# Patient Record
Sex: Female | Born: 1965
Health system: Southern US, Community
[De-identification: ages and names within clinical notes are randomized; demographics above are authoritative.]

## PROBLEM LIST (undated history)

## (undated) DIAGNOSIS — I10 Essential (primary) hypertension: Secondary | ICD-10-CM

## (undated) DIAGNOSIS — I499 Cardiac arrhythmia, unspecified: Secondary | ICD-10-CM

## (undated) DIAGNOSIS — R0600 Dyspnea, unspecified: Secondary | ICD-10-CM

## (undated) DIAGNOSIS — M1990 Unspecified osteoarthritis, unspecified site: Secondary | ICD-10-CM

## (undated) DIAGNOSIS — I209 Angina pectoris, unspecified: Secondary | ICD-10-CM

## (undated) HISTORY — PX: VARICOSE VEIN SURGERY: SHX832

---

## 2015-12-24 DIAGNOSIS — H40013 Open angle with borderline findings, low risk, bilateral: Secondary | ICD-10-CM | POA: Diagnosis not present

## 2015-12-24 DIAGNOSIS — H2513 Age-related nuclear cataract, bilateral: Secondary | ICD-10-CM | POA: Diagnosis not present

## 2015-12-24 DIAGNOSIS — H35342 Macular cyst, hole, or pseudohole, left eye: Secondary | ICD-10-CM | POA: Diagnosis not present

## 2015-12-24 DIAGNOSIS — Q120 Congenital cataract: Secondary | ICD-10-CM | POA: Diagnosis not present

## 2015-12-24 DIAGNOSIS — H1851 Endothelial corneal dystrophy: Secondary | ICD-10-CM | POA: Diagnosis not present

## 2015-12-27 DIAGNOSIS — G43909 Migraine, unspecified, not intractable, without status migrainosus: Secondary | ICD-10-CM | POA: Insufficient documentation

## 2015-12-27 DIAGNOSIS — M79606 Pain in leg, unspecified: Secondary | ICD-10-CM | POA: Insufficient documentation

## 2015-12-27 DIAGNOSIS — H2513 Age-related nuclear cataract, bilateral: Secondary | ICD-10-CM | POA: Insufficient documentation

## 2015-12-27 DIAGNOSIS — H1851 Endothelial corneal dystrophy: Secondary | ICD-10-CM

## 2015-12-27 DIAGNOSIS — Q120 Congenital cataract: Secondary | ICD-10-CM | POA: Insufficient documentation

## 2015-12-27 DIAGNOSIS — H35342 Macular cyst, hole, or pseudohole, left eye: Secondary | ICD-10-CM | POA: Insufficient documentation

## 2015-12-27 DIAGNOSIS — H18519 Endothelial corneal dystrophy, unspecified eye: Secondary | ICD-10-CM | POA: Insufficient documentation

## 2015-12-30 DIAGNOSIS — Z1231 Encounter for screening mammogram for malignant neoplasm of breast: Secondary | ICD-10-CM | POA: Diagnosis not present

## 2015-12-30 DIAGNOSIS — Z6833 Body mass index (BMI) 33.0-33.9, adult: Secondary | ICD-10-CM | POA: Diagnosis not present

## 2015-12-30 DIAGNOSIS — Z01419 Encounter for gynecological examination (general) (routine) without abnormal findings: Secondary | ICD-10-CM | POA: Diagnosis not present

## 2016-01-31 DIAGNOSIS — Z1231 Encounter for screening mammogram for malignant neoplasm of breast: Secondary | ICD-10-CM | POA: Diagnosis not present

## 2016-05-19 ENCOUNTER — Emergency Department (HOSPITAL_BASED_OUTPATIENT_CLINIC_OR_DEPARTMENT_OTHER)
Admission: EM | Admit: 2016-05-19 | Discharge: 2016-05-19 | Disposition: A | Discharge status: Home / Self Care | Payer: Self-pay | Attending: Emergency Medicine | Admitting: Emergency Medicine

## 2016-05-19 ENCOUNTER — Emergency Department (HOSPITAL_BASED_OUTPATIENT_CLINIC_OR_DEPARTMENT_OTHER): Payer: Self-pay

## 2016-05-19 ENCOUNTER — Encounter (HOSPITAL_BASED_OUTPATIENT_CLINIC_OR_DEPARTMENT_OTHER): Payer: Self-pay | Admitting: *Deleted

## 2016-05-19 DIAGNOSIS — X503XXA Overexertion from repetitive movements, initial encounter: Secondary | ICD-10-CM | POA: Insufficient documentation

## 2016-05-19 DIAGNOSIS — Y999 Unspecified external cause status: Secondary | ICD-10-CM | POA: Insufficient documentation

## 2016-05-19 DIAGNOSIS — R0789 Other chest pain: Secondary | ICD-10-CM | POA: Insufficient documentation

## 2016-05-19 DIAGNOSIS — M546 Pain in thoracic spine: Secondary | ICD-10-CM | POA: Insufficient documentation

## 2016-05-19 DIAGNOSIS — Y9389 Activity, other specified: Secondary | ICD-10-CM | POA: Insufficient documentation

## 2016-05-19 DIAGNOSIS — M62830 Muscle spasm of back: Secondary | ICD-10-CM | POA: Insufficient documentation

## 2016-05-19 DIAGNOSIS — Y929 Unspecified place or not applicable: Secondary | ICD-10-CM | POA: Insufficient documentation

## 2016-05-19 LAB — URINALYSIS, ROUTINE W REFLEX MICROSCOPIC
Bilirubin Urine: NEGATIVE
Glucose, UA: NEGATIVE mg/dL
HGB URINE DIPSTICK: NEGATIVE
Ketones, ur: NEGATIVE mg/dL
LEUKOCYTES UA: NEGATIVE
Nitrite: NEGATIVE
PROTEIN: NEGATIVE mg/dL
Specific Gravity, Urine: 1.024 (ref 1.005–1.030)
pH: 6.5 (ref 5.0–8.0)

## 2016-05-19 MED ORDER — NAPROXEN 250 MG PO TABS
500.0000 mg | ORAL_TABLET | Freq: Once | ORAL | Status: AC
Start: 2016-05-19 — End: 2016-05-19
  Administered 2016-05-19: 500 mg via ORAL
  Filled 2016-05-19: qty 2

## 2016-05-19 MED ORDER — CYCLOBENZAPRINE HCL 10 MG PO TABS: 10.0000 mg | ORAL_TABLET | Freq: Three times a day (TID) | ORAL | 0 refills | Status: DC | PRN

## 2016-05-19 MED ORDER — NAPROXEN 500 MG PO TABS: 500.0000 mg | ORAL_TABLET | Freq: Two times a day (BID) | ORAL | 0 refills | Status: DC

## 2016-05-19 MED FILL — NAPROXEN 500 MG TABLET: 500 | 10 days supply | Qty: 20 | Fill #0

## 2016-05-19 MED FILL — CYCLOBENZAPRINE 10 MG TAB: 10 | 5 days supply | Qty: 15 | Fill #0

## 2016-05-19 NOTE — ED Provider Notes (Signed)
[facility] Provider Note   CSN: [id number] Arrival date & time: 05/19/16  1206     History   Chief Complaint Chief Complaint  Patient presents with  . Back Injury    HPI Tanya Kelly is a 51 y.o. female with a PMHx of chronic headaches, who presents to the ED with complaints of right mid thoracic back pain that began 2 days ago. 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 She describes the pain as 8/10 constant pulling/pinching pain in the right mid back radiating up to her neck, worse with sitting and movement especially bending and twisting, and mildly improved with laying down, ice, and ibuprofen use. She states that on date of onset, she was sent home early from work; she went back to work today and the pain worsened so she came here for evaluation.  She denies fevers, chills, CP, SOB, abd pain, N/V/D/C, hematuria, dysuria, myalgias, arthralgias, numbness, tingling, weakness, incontinence of urine/stool, saddle anesthesia/cauda equina symptoms, hx of IVDU/CA/kidney stones, or any other complaints at this time.     The history is provided by the patient and medical records. No language interpreter was used.  Back Pain   This is a new problem. The current episode started 2 days ago. The problem occurs constantly. The problem has not changed since onset.The pain is associated with twisting. The pain is present in the thoracic spine. Quality: pulling/pinching. Radiates to: neck. The pain is at a severity of 8/10. The pain is moderate. The symptoms are aggravated by bending and twisting. The pain is the same all the time. Pertinent negatives include no chest pain, no fever, no numbness, no abdominal pain, no bowel incontinence, no perianal numbness, no bladder incontinence, no dysuria, no paresthesias, no paresis, no tingling and no weakness. She has  tried NSAIDs and ice for the symptoms. The treatment provided mild relief.    History reviewed. No pertinent past medical history.  There are no active problems to display for this patient.   Past Surgical History:  Procedure Laterality Date  . CESAREAN SECTION    . VARICOSE VEIN SURGERY      OB History    No data available       Home Medications    Prior to Admission medications   Not on File    Family History No family history on file.  Social History Social History  Substance Use Topics  . Smoking status: Never Smoker  . Smokeless tobacco: Never Used  . Alcohol use Yes     Allergies   Codeine   Review of Systems Review of Systems  Constitutional: Negative for chills and fever.  Respiratory: Negative for shortness of breath.   Cardiovascular: Negative for chest pain.  Gastrointestinal: Negative for abdominal pain, bowel incontinence, constipation, diarrhea, nausea and vomiting.  Genitourinary: Positive for flank pain. Negative for bladder incontinence, dysuria and hematuria.  Musculoskeletal: Positive for back pain. Negative for arthralgias and myalgias.  Skin: Negative for color change.  Allergic/Immunologic: Negative for immunocompromised state.  Neurological: Negative for tingling, weakness, numbness and paresthesias.  Psychiatric/Behavioral: Negative for confusion.   10 Systems reviewed and are negative for acute change except as noted in the HPI.   Physical Exam Updated Vital Signs BP 141/92 (BP Location: Right Arm)   Pulse 66   Temp 98.5 F (36.9 C) (Oral)  Resp 18   Ht 5\' 1"  (1.549 m)   Wt 83.9 kg   SpO2 100%   BMI 34.96 kg/m   Physical Exam  Constitutional: She is oriented to person, place, and time. Vital signs are normal. She appears well-developed and well-nourished.  Non-toxic appearance. No distress.  Afebrile, nontoxic, NAD  HENT:  Head: Normocephalic and atraumatic.  Mouth/Throat: Mucous membranes are normal.  Eyes:  Conjunctivae and EOM are normal. Right eye exhibits no discharge. Left eye exhibits no discharge.  Neck: Normal range of motion. Neck supple.  Cardiovascular: Normal rate and intact distal pulses.   Pulmonary/Chest: Effort normal. No respiratory distress.     She exhibits tenderness. She exhibits no crepitus, no deformity and no retraction.  Chest wall with mild TTP to the R CVA/thoracic back area extending toward R lateral ribs, without crepitus, deformities, or retractions   Abdominal: Normal appearance and bowel sounds are normal. She exhibits no distension. There is no tenderness. There is CVA tenderness. There is no rigidity, no rebound, no guarding, no tenderness at McBurney's point and negative Murphy's sign.  Soft, NTND, +BS throughout, no r/g/r, neg murphy's, neg mcburney's, with mild R sided CVA TTP somewhat in the R thoracic paraspinous muscles but near the CVA region and toward the R lateral rib cage area  Musculoskeletal: Normal range of motion.       Thoracic back: She exhibits tenderness and spasm. She exhibits normal range of motion and no deformity.       Back:  Thoracic spine with FROM intact without spinous process TTP, no bony stepoffs or deformities, with mild R sided paraspinous muscle TTP and slight muscle spasm felt, around the CVA region. Strength and sensation grossly intact in all extremities, negative SLR bilaterally, gait steady and nonantalgic. No overlying skin changes. Distal pulses intact.   Neurological: She is alert and oriented to person, place, and time. She has normal strength. No sensory deficit.  Skin: Skin is warm, dry and intact. No rash noted.  Psychiatric: She has a normal mood and affect. Her behavior is normal.  Nursing note and vitals reviewed.    ED Treatments / Results  Labs (all labs ordered are listed, but only abnormal results are displayed) Labs Reviewed  URINALYSIS, ROUTINE W REFLEX MICROSCOPIC    EKG  EKG Interpretation None         Radiology Dg Abd Acute W/chest  Result Date: 05/19/2016 CLINICAL DATA:  Right flank pain since Wednesday EXAM: DG ABDOMEN ACUTE W/ 1V CHEST COMPARISON:  None. FINDINGS: Single-view chest demonstrates no acute infiltrate or effusion. Borderline to mild cardiomegaly without edema. No pneumothorax. Supine and upright views of the abdomen demonstrate no free air beneath the diaphragm. There is a nonobstructed bowel-gas pattern with moderate stool in the colon. 5 mm calcification in the right hemipelvis. Mild fecal impaction in the rectum. IMPRESSION: 1. Mild cardiomegaly.  No infiltrate or edema 2. Nonobstructed gas pattern 3. 5 mm calcification in the right hemipelvis, distal ureteral stone cannot be excluded. CT KUB could be obtained for further evaluation. Electronically Signed   By: Jasmine Pang M.D.   On: 05/19/2016 13:58    Procedures Procedures (including critical care time)  Medications Ordered in ED Medications  naproxen (NAPROSYN) tablet 500 mg (500 mg Oral Given 05/19/16 1354)     Initial Impression / Assessment and Plan / ED Course  I have reviewed the triage vital signs and the nursing notes.  Pertinent labs & imaging results that were available during  my care of the patient were reviewed by me and considered in my medical decision making (see chart for details).  Clinical Course     51 y.o. female here with R flank/thoracic back pain that began after she pulled herself up off the roller she was on while working underneath a bus; No red flag s/s of low back pain. No s/s of central cord compression or cauda equina. Lower extremities are neurovascularly intact and patient is ambulating without difficulty. Mild tenderness to R lateral rib cage area, and R flank area; no urinary complaints, but wonder if this is musculoskeletal vs ?kidney stone vs urinary infection; no abdominal tenderness. Will obtain U/A and Acute abd series to eval for stone/etc (acute abd series will give more  info than KUB, so will opt for this instead). Will give naprosyn and reassess shortly  2:37 PM U/A completely unremarkable. Acute abd series showing 5mm calcification in R hemipelvis, unable to r/o ureteral stone; given lack of abd pain that would be expected with a stone this far down, and lack of hematuria or any findings on U/A, doubt this truly represents a kidney stone; however, instructed pt to stay hydrated in order to help if this is a stone, then it'll help it pass. Otherwise, Pain likely from muscle strain. Patient was counseled on back pain precautions and told to do activity as tolerated but do not lift, push, or pull heavy objects more than 10 pounds for the next week. Patient counseled to use ice or heat on back for no longer than 15 minutes every hour.   Rx given for muscle relaxer and counseled on proper use of muscle relaxant medication. Urged patient not to drink alcohol, drive, or perform any other activities that requires focus while taking these medications. Rx given for naprosyn. Discussed additional tylenol use.   Patient urged to follow-up with PCP if pain does not improve with treatment and rest or if pain becomes recurrent. Urged to return with worsening severe pain, loss of bowel or bladder control, trouble walking. The patient verbalizes understanding and agrees with the plan.   Final Clinical Impressions(s) / ED Diagnoses   Final diagnoses:  Acute right-sided thoracic back pain  Muscle spasm of back    New Prescriptions New Prescriptions   CYCLOBENZAPRINE (FLEXERIL) 10 MG TABLET    Take 1 tablet (10 mg total) by mouth 3 (three) times daily as needed for muscle spasms.   NAPROXEN (NAPROSYN) 500 MG TABLET    Take 1 tablet (500 mg total) by mouth 2 (two) times daily with a meal.     [address]Deatrice Spanbauer Strupp Chasitty Hehl, PA-C 05/19/16 1438    Laurence Spatesachel Morgan Little, MD 05/23/16 [phone]

## 2016-05-19 NOTE — Discharge Instructions (Signed)
Your urine specimen looked fine today, and your xray shows a small calcification that COULD be a kidney stone but isn't definitely one, and since your urine sample was fine, it's doubtful that this is truly a kidney stone. Your back pain is likely from a pulled muscle or other musculoskeletal source. Follow the instructions below regarding back pain; however, make sure you stay very well hydrated in the event this is a kidney stone, it will help flush it out. Follow up with your regular doctor in 1 week for recheck of symptoms.  Back Pain: Your back pain should be treated with medicines such as ibuprofen or aleve and this back pain should get better over the next 2 weeks.  However if you develop severe or worsening pain, low back pain with fever, numbness, weakness or inability to walk or urinate, you should return to the ER immediately.  Please follow up with your doctor this week for a recheck if still having symptoms.  Avoid heavy lifting over 10 pounds over the next two weeks.  Back pain is discomfort in the back that may be due to injuries to muscles and ligaments around the spine.  Occasionally, it may be caused by a a problem to a part of the spine called a disc.  The pain may last several days or a week;  However, most patients get completely well in 4 weeks.  Self - care:  The application of heat can help soothe the pain.  Maintaining your daily activities, including walking, is encourged, as it will help you get better faster than just staying in bed. Perform gentle stretching as discussed. Drink plenty of fluids.  Medications are also useful to help with pain control.  A commonly prescribed medication includes tylenol. Use over the counter tylenol as directed by the bottle.  Non steroidal anti inflammatory medications including Ibuprofen and naproxen;  These medications help both pain and swelling and are very useful in treating back pain.  They should be taken with food, as they can cause  stomach upset, and more seriously, stomach bleeding.    Muscle relaxants:  These medications can help with muscle tightness that is a cause of lower back pain.  Most of these medications can cause drowsiness, and it is not safe to drive or use dangerous machinery while taking them.  SEEK IMMEDIATE MEDICAL ATTENTION IF: New numbness, tingling, weakness, or problem with the use of your arms or legs.  Severe back pain not relieved with medications.  Difficulty with or loss of control of your bowel or bladder control.  Increasing pain in any areas of the body (such as chest or abdominal pain).  Shortness of breath, dizziness or fainting.  Nausea (feeling sick to your stomach), vomiting, fever, or sweats.  You will need to follow up with  Your primary healthcare provider in 1-2 weeks for reassessment.

## 2016-05-19 NOTE — ED Triage Notes (Signed)
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

## 2016-10-14 DIAGNOSIS — M79661 Pain in right lower leg: Secondary | ICD-10-CM | POA: Diagnosis not present

## 2016-10-14 DIAGNOSIS — R200 Anesthesia of skin: Secondary | ICD-10-CM | POA: Diagnosis not present

## 2016-10-14 DIAGNOSIS — E876 Hypokalemia: Secondary | ICD-10-CM | POA: Diagnosis not present

## 2016-10-14 DIAGNOSIS — Z7982 Long term (current) use of aspirin: Secondary | ICD-10-CM | POA: Diagnosis not present

## 2016-10-14 DIAGNOSIS — Z885 Allergy status to narcotic agent status: Secondary | ICD-10-CM | POA: Diagnosis not present

## 2016-10-14 DIAGNOSIS — M79604 Pain in right leg: Secondary | ICD-10-CM | POA: Diagnosis not present

## 2017-04-08 ENCOUNTER — Encounter (HOSPITAL_BASED_OUTPATIENT_CLINIC_OR_DEPARTMENT_OTHER): Payer: Self-pay | Admitting: *Deleted

## 2017-04-08 ENCOUNTER — Other Ambulatory Visit: Payer: Self-pay

## 2017-04-08 ENCOUNTER — Emergency Department (HOSPITAL_BASED_OUTPATIENT_CLINIC_OR_DEPARTMENT_OTHER): Payer: BLUE CROSS/BLUE SHIELD

## 2017-04-08 ENCOUNTER — Emergency Department (HOSPITAL_BASED_OUTPATIENT_CLINIC_OR_DEPARTMENT_OTHER)
Admission: EM | Admit: 2017-04-08 | Discharge: 2017-04-08 | Disposition: A | Discharge status: Home / Self Care | Payer: BLUE CROSS/BLUE SHIELD | Attending: Emergency Medicine | Admitting: Emergency Medicine

## 2017-04-08 DIAGNOSIS — Z79899 Other long term (current) drug therapy: Secondary | ICD-10-CM | POA: Insufficient documentation

## 2017-04-08 DIAGNOSIS — N858 Other specified noninflammatory disorders of uterus: Secondary | ICD-10-CM | POA: Diagnosis not present

## 2017-04-08 DIAGNOSIS — R0789 Other chest pain: Secondary | ICD-10-CM

## 2017-04-08 DIAGNOSIS — R079 Chest pain, unspecified: Secondary | ICD-10-CM | POA: Diagnosis not present

## 2017-04-08 LAB — COMPREHENSIVE METABOLIC PANEL
ALBUMIN: 3.6 g/dL (ref 3.5–5.0)
ALK PHOS: 56 U/L (ref 38–126)
ALT: 16 U/L (ref 14–54)
ANION GAP: 6 (ref 5–15)
AST: 20 U/L (ref 15–41)
BILIRUBIN TOTAL: 0.4 mg/dL (ref 0.3–1.2)
BUN: 12 mg/dL (ref 6–20)
CALCIUM: 8.8 mg/dL — AB (ref 8.9–10.3)
CO2: 24 mmol/L (ref 22–32)
CREATININE: 0.58 mg/dL (ref 0.44–1.00)
Chloride: 106 mmol/L (ref 101–111)
GFR calc Af Amer: >60 mL/min (ref >60)
GFR calc non Af Amer: >60 mL/min (ref >60)
GLUCOSE: 115 mg/dL — AB (ref 65–99)
Potassium: 3.3 mmol/L — ABNORMAL LOW (ref 3.5–5.1)
SODIUM: 136 mmol/L (ref 135–145)
TOTAL PROTEIN: 7.5 g/dL (ref 6.5–8.1)

## 2017-04-08 LAB — CBC WITH DIFFERENTIAL/PLATELET
BASOS ABS: 0 10*3/uL (ref 0.0–0.1)
BASOS PCT: 0 %
EOS ABS: 0.2 10*3/uL (ref 0.0–0.7)
Eosinophils Relative: 3 %
HEMATOCRIT: 33.2 % — AB (ref 36.0–46.0)
HEMOGLOBIN: 10.5 g/dL — AB (ref 12.0–15.0)
Lymphocytes Relative: 38 %
Lymphs Abs: 2 10*3/uL (ref 0.7–4.0)
MCH: 25.7 pg — ABNORMAL LOW (ref 26.0–34.0)
MCHC: 31.6 g/dL (ref 30.0–36.0)
MCV: 81.2 fL (ref 78.0–100.0)
MONOS PCT: 9 %
Monocytes Absolute: 0.5 10*3/uL (ref 0.1–1.0)
NEUTROS ABS: 2.7 10*3/uL (ref 1.7–7.7)
NEUTROS PCT: 50 %
Platelets: 191 10*3/uL (ref 150–400)
RBC: 4.09 MIL/uL (ref 3.87–5.11)
RDW: 15.4 % (ref 11.5–15.5)
WBC: 5.3 10*3/uL (ref 4.0–10.5)

## 2017-04-08 LAB — TROPONIN I: Troponin I: <0.03 ng/mL (ref <0.03)

## 2017-04-08 LAB — D-DIMER, QUANTITATIVE: D-Dimer, Quant: <0.27 ug/mL-FEU (ref 0.00–0.50)

## 2017-04-08 MED ORDER — MORPHINE SULFATE (PF) 4 MG/ML IV SOLN
4.0000 mg | Freq: Once | INTRAVENOUS | Status: AC
  Administered 2017-04-08: 4 mg via INTRAVENOUS
  Filled 2017-04-08: qty 1

## 2017-04-08 MED ORDER — ONDANSETRON HCL 4 MG/2ML IJ SOLN
4.0000 mg | Freq: Once | INTRAMUSCULAR | Status: AC
Start: 2017-04-08 — End: 2017-04-08
  Administered 2017-04-08: 4 mg via INTRAVENOUS
  Filled 2017-04-08: qty 2

## 2017-04-08 MED ORDER — IOPAMIDOL (ISOVUE-370) INJECTION 76%
100.0000 mL | Freq: Once | INTRAVENOUS | Status: AC | PRN
  Administered 2017-04-08: 100 mL via INTRAVENOUS

## 2017-04-08 NOTE — ED Notes (Signed)
First EKG was done incorrect and second EKG done correctly. No charge for first EKG. MD Plunkett given both and informed.

## 2017-04-08 NOTE — ED Triage Notes (Signed)
EKG in progress. EDP into room during triage.   Alert, NAD, calm, interactive, resps e/u, speaking in clear complete sentences, no dyspnea noted, skin W&D, c/o CP, onset 1430, mid sternal with radiation to R arm, took ASA 325mg  at 1700, also took ibuprofen earlier in the morning for a toothache (present for months), (denies: sob, nausea, dizziness, weakness, fever, cough congestion, cold sx, back pain, indigestion, HA or other sx), last at 2130, [facility] BM Saturday am[facility]

## 2017-04-08 NOTE — ED Notes (Signed)
Pt on monitor 

## 2017-04-08 NOTE — ED Notes (Signed)
Back from CT, no changes.  

## 2017-04-08 NOTE — ED Notes (Signed)
EDP in to room to update pt/family on results and plan.

## 2017-04-08 NOTE — ED Provider Notes (Signed)
[facility] EMERGENCY DEPARTMENT Provider Note   CSN: [id number] Arrival date & time: 04/08/17  0134     History   Chief Complaint Chief Complaint  Patient presents with  . Chest Pain    HPI Tanya Kelly is a 51 y.o. female.  The history is provided by the patient.  Chest Pain   This is a new problem. Episode onset: 12 hours. The problem occurs constantly. The problem has been gradually worsening. Associated with: started while sitting doing nothing. The pain is present in the substernal region. The pain is at a severity of 8/10. The pain is severe. The quality of the pain is described as sharp. The pain radiates to the right arm (also radiating into the upper back and making the right arm and leg feel weird). Duration of episode(s) is 12 hours. Exacerbated by: eating and walking do not seem to make it worse. Associated symptoms include back pain. Pertinent negatives include no abdominal pain, no cough, no diaphoresis, no exertional chest pressure, no fever, no leg pain, no lower extremity edema, no nausea, no palpitations, no shortness of breath, no sputum production and no vomiting. Treatments tried: took 1 aspirin. The treatment provided no relief. There are no known risk factors. Past medical history comments: no PMH  Her family medical history is significant for CAD and early MI. Family history comments: grandmother died in late 3850's of massive MI  Procedure history is negative for cardiac catheterization, echocardiogram and stress echo.    History reviewed. No pertinent past medical history.  There are no active problems to display for this patient.   Past Surgical History:  Procedure Laterality Date  . CESAREAN SECTION    . VARICOSE VEIN SURGERY      OB History    No data available       Home Medications    Prior to Admission medications   Medication Sig Start Date End Date Taking? Authorizing Provider  cyclobenzaprine (FLEXERIL) 10 MG tablet Take 1  tablet (10 mg total) by mouth 3 (three) times daily as needed for muscle spasms. 05/19/16   Street, EdinaMercedes, PA-C  naproxen (NAPROSYN) 500 MG tablet Take 1 tablet (500 mg total) by mouth 2 (two) times daily with a meal. 05/19/16   Street, [place]cedes, [place]C    Family History History reviewed. No pertinent family history.  Social History Social History   Tobacco Use  . Smoking status: Never Smoker  . Smokeless tobacco: Never Used  Substance Use Topics  . Alcohol use: Yes  . Drug use: No     Allergies   Codeine   Review of Systems Review of Systems  Constitutional: Negative for diaphoresis and fever.  Respiratory: Negative for cough, sputum production and shortness of breath.   Cardiovascular: Positive for chest pain. Negative for palpitations.  Gastrointestinal: Negative for abdominal pain, nausea and vomiting.  Musculoskeletal: Positive for back pain.  All other systems reviewed and are negative.    Physical Exam Updated Vital Signs BP (!) 152/78 (BP Location: Left Arm)   Pulse 65   Temp 98.2 F (36.8 C) (Oral)   Resp 18   SpO2 100%   Physical Exam  Constitutional: She is oriented to person, place, and time. She appears well-developed and well-nourished. No distress.  HENT:  Head: Normocephalic and atraumatic.  Mouth/Throat: Oropharynx is clear and moist.  Eyes: Conjunctivae and EOM are normal. Pupils are equal, round, and reactive to light.  Neck: Normal range of motion. Neck supple.  Cardiovascular:  Normal rate, regular rhythm and intact distal pulses.  No murmur heard. Pulses:      Radial pulses are 2+ on the right side, and 2+ on the left side.       Dorsalis pedis pulses are 2+ on the right side, and 2+ on the left side.  Pulmonary/Chest: Effort normal and breath sounds normal. No respiratory distress. She has no wheezes. She has no rales.  Abdominal: Soft. She exhibits no distension. There is no tenderness. There is no rebound and no guarding.    Musculoskeletal: Normal range of motion. She exhibits no edema or tenderness.  Neurological: She is alert and oriented to person, place, and time.  Skin: Skin is warm and dry. No rash noted. No erythema.  Psychiatric: She has a normal mood and affect. Her behavior is normal.  Nursing note and vitals reviewed.    ED Treatments / Results  Labs (all labs ordered are listed, but only abnormal results are displayed) Labs Reviewed  CBC WITH DIFFERENTIAL/PLATELET - Abnormal; Notable for the following components:      Result Value   Hemoglobin 10.5 (*)    HCT 33.2 (*)    MCH 25.7 (*)    All other components within normal limits  COMPREHENSIVE METABOLIC PANEL - Abnormal; Notable for the following components:   Potassium 3.3 (*)    Glucose, Bld 115 (*)    Calcium 8.8 (*)    All other components within normal limits  TROPONIN I  D-DIMER, QUANTITATIVE (NOT AT [facility])    EKG  EKG Interpretation  Date/Time:  Sunday April 08 2017 01:56:11 EST Ventricular Rate:  57 PR Interval:    QRS Duration: 81 QT Interval:  411 QTC Calculation: 401 R Axis:   53 Text Interpretation:  Sinus rhythm Abnormal R-wave progression, early transition Borderline ST elevation, lateral leads No previous tracing Confirmed by Gwyneth Sprout ([id number]) on 04/08/2017 2:15:47 AM       Radiology Dg Chest 2 View  Result Date: 04/08/2017 CLINICAL DATA:  Chest pain for 12 hours radiating into right arm. EXAM: CHEST  2 VIEW COMPARISON:  05/19/2016 FINDINGS: The cardiomediastinal contours are normal. Mild cardiomegaly unchanged from prior exam. Pulmonary vasculature is normal. No consolidation, pleural effusion, or pneumothorax. No acute osseous abnormalities are seen. IMPRESSION: Unchanged mild cardiomegaly. No congestive failure or acute pulmonary process. Electronically Signed   By: Rubye Oaks M.D.   On: 04/08/2017 03:06   Ct Angio Chest Aorta W And/or Wo Contrast  Result Date: 04/08/2017 CLINICAL DATA:   Chest pain radiating to the right arm. EXAM: CT ANGIOGRAPHY CHEST, ABDOMEN AND PELVIS TECHNIQUE: Multidetector CT imaging through the chest, abdomen and pelvis was performed using the standard protocol during bolus administration of intravenous contrast. Multiplanar reconstructed images and MIPs were obtained and reviewed to evaluate the vascular anatomy. CONTRAST:  ISOVUE-370 IOPAMIDOL (ISOVUE-370) INJECTION 76% COMPARISON:  Chest radiograph earlier this day FINDINGS: CTA CHEST FINDINGS Cardiovascular: Normal caliber thoracic aorta without dissection, aneurysm, aortic hematoma or acute aortic syndrome. Conventional branching pattern from the aortic arch. Central most pulmonary arteries to the lobar level are patent. Heart size upper normal. No pericardial fluid. Mediastinum/Nodes: No mediastinal or hilar adenopathy. The esophagus is decompressed. Visualized thyroid gland is normal. Lungs/Pleura: Minimal subpleural densities in the right lower lobe likely atelectasis. No consolidation. No pulmonary edema. No pleural fluid. No pulmonary mass or suspicious nodule. Musculoskeletal: There are no acute or suspicious osseous abnormalities. Review of the MIP images confirms the above findings. CTA ABDOMEN  AND PELVIS FINDINGS VASCULAR Aorta: Normal caliber aorta without aneurysm, dissection, vasculitis or significant stenosis. Celiac: Patent without evidence of aneurysm, dissection, vasculitis or significant stenosis. SMA: Patent without evidence of aneurysm, dissection, vasculitis or significant stenosis. Renals: 2 right renal arteries appear codominant. Both renal arteries are patent without evidence of aneurysm, dissection, vasculitis, fibromuscular dysplasia or significant stenosis. IMA: Patent without evidence of aneurysm, dissection, vasculitis or significant stenosis. Inflow: Patent without evidence of aneurysm, dissection, vasculitis or significant stenosis. Veins: No obvious venous abnormality within the  limitations of this arterial phase study. Hepatobiliary: No evidence of focal lesion on arterial phase imaging. Gallbladder physiologically distended, no calcified stone. No biliary dilatation. Pancreas: No ductal dilatation or inflammation. Spleen: Normal arterial phase enhancement.  Normal in size. Adrenals/Urinary Tract: Normal adrenal glands. No hydronephrosis or perinephric edema. Homogeneous renal enhancement. Urinary bladder is physiologically distended without wall thickening. Stomach/Bowel: Stomach is physiologically distended. No bowel wall thickening, inflammation or obstruction. Moderate colonic stool burden. Tortuous sigmoid colon. Normal appendix. Lymphatic: No enlarged abdominal or pelvic lymph nodes. Reproductive: Bulky uterus with possible fibroids. No evidence of adnexal mass, adnexa partially obscured by adjacent bowel. Other: No ascites or free air. No intra-abdominal abscess. Small fat containing umbilical hernia. Musculoskeletal: Degenerative change in the spine. There are no acute or suspicious osseous abnormalities. Review of the MIP images confirms the above findings. IMPRESSION: 1. Normal thoracoabdominal aorta without dissection or acute aortic abnormality. 2. No acute abnormality in the chest, abdomen, or pelvis. 3. Incidental finding of bulbous uterus, possible uterine fibroids. Electronically Signed   By: Rubye OaksMelanie  Ehinger M.D.   On: 04/08/2017 04:26   Ct Angio Abd/pel W And/or Wo Contrast  Result Date: 04/08/2017 CLINICAL DATA:  Chest pain radiating to the right arm. EXAM: CT ANGIOGRAPHY CHEST, ABDOMEN AND PELVIS TECHNIQUE: Multidetector CT imaging through the chest, abdomen and pelvis was performed using the standard protocol during bolus administration of intravenous contrast. Multiplanar reconstructed images and MIPs were obtained and reviewed to evaluate the vascular anatomy. CONTRAST:  100mL ISOVUE-370 IOPAMIDOL (ISOVUE-370) INJECTION 76% COMPARISON:  Chest radiograph earlier  this day FINDINGS: CTA CHEST FINDINGS Cardiovascular: Normal caliber thoracic aorta without dissection, aneurysm, aortic hematoma or acute aortic syndrome. Conventional branching pattern from the aortic arch. Central most pulmonary arteries to the lobar level are patent. Heart size upper normal. No pericardial fluid. Mediastinum/Nodes: No mediastinal or hilar adenopathy. The esophagus is decompressed. Visualized thyroid gland is normal. Lungs/Pleura: Minimal subpleural densities in the right lower lobe likely atelectasis. No consolidation. No pulmonary edema. No pleural fluid. No pulmonary mass or suspicious nodule. Musculoskeletal: There are no acute or suspicious osseous abnormalities. Review of the MIP images confirms the above findings. CTA ABDOMEN AND PELVIS FINDINGS VASCULAR Aorta: Normal caliber aorta without aneurysm, dissection, vasculitis or significant stenosis. Celiac: Patent without evidence of aneurysm, dissection, vasculitis or significant stenosis. SMA: Patent without evidence of aneurysm, dissection, vasculitis or significant stenosis. Renals: 2 right renal arteries appear codominant. Both renal arteries are patent without evidence of aneurysm, dissection, vasculitis, fibromuscular dysplasia or significant stenosis. IMA: Patent without evidence of aneurysm, dissection, vasculitis or significant stenosis. Inflow: Patent without evidence of aneurysm, dissection, vasculitis or significant stenosis. Veins: No obvious venous abnormality within the limitations of this arterial phase study. Hepatobiliary: No evidence of focal lesion on arterial phase imaging. Gallbladder physiologically distended, no calcified stone. No biliary dilatation. Pancreas: No ductal dilatation or inflammation. Spleen: Normal arterial phase enhancement.  Normal in size. Adrenals/Urinary Tract: Normal adrenal glands. No hydronephrosis or perinephric  edema. Homogeneous renal enhancement. Urinary bladder is physiologically distended  without wall thickening. Stomach/Bowel: Stomach is physiologically distended. No bowel wall thickening, inflammation or obstruction. Moderate colonic stool burden. Tortuous sigmoid colon. Normal appendix. Lymphatic: No enlarged abdominal or pelvic lymph nodes. Reproductive: Bulky uterus with possible fibroids. No evidence of adnexal mass, adnexa partially obscured by adjacent bowel. Other: No ascites or free air. No intra-abdominal abscess. Small fat containing umbilical hernia. Musculoskeletal: Degenerative change in the spine. There are no acute or suspicious osseous abnormalities. Review of the MIP images confirms the above findings. IMPRESSION: 1. Normal thoracoabdominal aorta without dissection or acute aortic abnormality. 2. No acute abnormality in the chest, abdomen, or pelvis. 3. Incidental finding of bulbous uterus, possible uterine fibroids. Electronically Signed   By: Rubye Oaks M.D.   On: 04/08/2017 04:26    Procedures Procedures (including critical care time)  Medications Ordered in ED Medications  morphine 4 MG/ML injection 4 mg (not administered)  ondansetron (ZOFRAN) injection 4 mg (not administered)     Initial Impression / Assessment and Plan / ED Course  I have reviewed the triage vital signs and the nursing notes.  Pertinent labs & imaging results that were available during my care of the patient were reviewed by me and considered in my medical decision making (see chart for details).     Patient is a healthy 51 year old female presenting with chest pain today that radiates into her right arm and makes her right arm and right leg feel unusual.  Also complaining of radiation into the back.  Symptoms are not exacerbated by anything that she can tell but they have worsened in the last 12 hours.  She did try to take an aspirin without any improvement at home.  She has no known cardiac risk factors except for a family history of her grandma in her late 61s that had an MI  causing death.  Patient does not abuse alcohol, drugs and does not smoke cigarettes.  She is having no abdominal pain and symptoms do not seem to worsen from food.  Patient denies any recent infectious symptoms.  She has not had cough or congestion.  EKG with no significant findings concerning for MI however symptoms are concerning for ACS versus dissection versus possible pneumothorax.  Lower suspicion for abdominal etiologies such as gallstones, pneumonia and PE. CBC, CMP, troponin, d-dimer, chest x-ray pending.  2:56 AM  EKG and all labs without concerning findings.  CXR pending.  Will get CTA to r/o dissection.  4:36 AM Pt dissection study is neg for dissection, PE or other acute process.  Pt after 4mg  of morphine has had resolutions of pain.  NAD at this time.  Trop neg after 12 hours of pain and doubt ACS.  Second EKG is unchanged.  Possible pt's sx are GI related however no pain on abd exam and low suspicion for pancreatitis or cholecystitis at this time.  Pt and husband given return precautions and instructed to f/u with PCP this week if recurrent sx.  Final Clinical Impressions(s) / ED Diagnoses   Final diagnoses:  Atypical chest pain    ED Discharge Orders    None       Gwyneth Sprout, MD 04/08/17 [phone]

## 2017-04-08 NOTE — ED Notes (Addendum)
IV established by US, blood sent, pt to xray, no changes, alert, NAD, calm, interactive, VSS.

## 2017-04-09 DIAGNOSIS — B351 Tinea unguium: Secondary | ICD-10-CM | POA: Insufficient documentation

## 2017-04-09 DIAGNOSIS — B353 Tinea pedis: Secondary | ICD-10-CM | POA: Insufficient documentation

## 2017-05-06 ENCOUNTER — Other Ambulatory Visit: Payer: Self-pay

## 2017-05-06 ENCOUNTER — Emergency Department (HOSPITAL_BASED_OUTPATIENT_CLINIC_OR_DEPARTMENT_OTHER): Payer: BLUE CROSS/BLUE SHIELD

## 2017-05-06 ENCOUNTER — Emergency Department (HOSPITAL_BASED_OUTPATIENT_CLINIC_OR_DEPARTMENT_OTHER)
Admission: EM | Admit: 2017-05-06 | Discharge: 2017-05-06 | Disposition: A | Discharge status: Home / Self Care | Payer: BLUE CROSS/BLUE SHIELD | Attending: Emergency Medicine | Admitting: Emergency Medicine

## 2017-05-06 ENCOUNTER — Encounter (HOSPITAL_BASED_OUTPATIENT_CLINIC_OR_DEPARTMENT_OTHER): Payer: Self-pay | Admitting: Emergency Medicine

## 2017-05-06 DIAGNOSIS — M79641 Pain in right hand: Secondary | ICD-10-CM

## 2017-05-06 DIAGNOSIS — M7989 Other specified soft tissue disorders: Secondary | ICD-10-CM | POA: Diagnosis not present

## 2017-05-06 MED ORDER — IBUPROFEN 200 MG PO TABS
600.0000 mg | ORAL_TABLET | Freq: Once | ORAL | Status: AC
  Administered 2017-05-06: 600 mg via ORAL
  Filled 2017-05-06: qty 1

## 2017-05-06 MED ORDER — CEPHALEXIN 250 MG PO CAPS
500.0000 mg | ORAL_CAPSULE | Freq: Once | ORAL | Status: AC
Start: 2017-05-06 — End: 2017-05-06
  Administered 2017-05-06: 500 mg via ORAL
  Filled 2017-05-06: qty 2

## 2017-05-06 MED ORDER — IBUPROFEN 600 MG PO TABS: 600.0000 mg | ORAL_TABLET | Freq: Four times a day (QID) | ORAL | 0 refills | Status: DC | PRN

## 2017-05-06 MED ORDER — CEPHALEXIN 500 MG PO CAPS: 500.0000 mg | ORAL_CAPSULE | Freq: Four times a day (QID) | ORAL | 0 refills | Status: DC

## 2017-05-06 NOTE — ED Notes (Signed)
Pt given d/c instructions as per chart. Rx x 2. Verbalizes understanding. No questions. 

## 2017-05-06 NOTE — Discharge Instructions (Signed)
Take the medications as prescribed, follow-up with your primary care doctor or consider seeing sports medicine or orthopedic doctor for further evaluation if the symptoms persist.  Dr. Pearletha ForgeHudnall is  a sports medicine doctor that works in this building.

## 2017-05-06 NOTE — ED Triage Notes (Signed)
Reports right hand swelling since Saturday.  Erythema noted.

## 2017-05-06 NOTE — ED Notes (Signed)
Alert, NAD, calm, interactive, resps e/u, speaking in clear complete sentences, no dyspnea noted, skin W&D,c/o continued hand pain, swelling remains, keeping hand elevated, (denies: other sx). EDP into room.

## 2017-05-06 NOTE — ED Provider Notes (Signed)
[facility] EMERGENCY DEPARTMENT Provider Note   CSN: [id number] Arrival date & time: 05/06/17  1513     History   Chief Complaint Chief Complaint  Patient presents with  . Hand Problem    HPI Hilary Hertzudrey Birchall is a 51 y.o. female.  Pt does not recall any injury.  It started swelling in her right hand.  No fevers.  No    The history is provided by the patient.  Hand Pain  This is a new problem. The current episode started 2 days ago. The problem occurs constantly. The problem has been gradually worsening. Pertinent negatives include no chest pain, no abdominal pain, no headaches and no shortness of breath. Exacerbated by: movement. Nothing relieves the symptoms. She has tried ASA for the symptoms.    History reviewed. No pertinent past medical history.  There are no active problems to display for this patient.   Past Surgical History:  Procedure Laterality Date  . CESAREAN SECTION    . VARICOSE VEIN SURGERY      OB History    No data available       Home Medications    Prior to Admission medications   Medication Sig Start Date End Date Taking? Authorizing Provider  cephALEXin (KEFLEX) 500 MG capsule Take 1 capsule (500 mg total) by mouth 4 (four) times daily. 05/06/17   Linwood DibblesKnapp, Kimon Loewen, MD  cyclobenzaprine (FLEXERIL) 10 MG tablet Take 1 tablet (10 mg total) by mouth 3 (three) times daily as needed for muscle spasms. 05/19/16   Street, [place]Mercedes, PA-C  ibuprofen (ADVIL,MOTRIN) 600 MG tablet Take 1 tablet (600 mg total) by mouth every 6 (six) hours as needed. 05/06/17   Linwood DibblesKnapp, Lismary Kiehn, MD  naproxen (NAPROSYN) 500 MG tablet Take 1 tablet (500 mg total) by mouth 2 (two) times daily with a meal. 05/19/16   Street, [place]cedes, [place]-C    Family History History reviewed. No pertinent family history.  Social History Social History   Tobacco Use  . Smoking status: Never Smoker  . Smokeless tobacco: Never Used  Substance Use Topics  . Alcohol use: Yes  . Drug use: No      Allergies   Codeine   Review of Systems Review of Systems  Respiratory: Negative for shortness of breath.   Cardiovascular: Negative for chest pain.  Gastrointestinal: Negative for abdominal pain.  Neurological: Negative for headaches.  All other systems reviewed and are negative.    Physical Exam Updated Vital Signs BP 129/86 (BP Location: Left Arm)   Pulse 78   Temp 98.8 F (37.1 C) (Oral)   Resp 20   Ht 1.549 m (5\' 1" )   Wt 87.1 kg (192 lb)   LMP 04/13/2017   SpO2 100%   BMI 36.28 kg/m   Physical Exam  Constitutional: She appears well-developed and well-nourished. No distress.  HENT:  Head: Normocephalic and atraumatic.  Right Ear: External ear normal.  Left Ear: External ear normal.  Eyes: Conjunctivae are normal. Right eye exhibits no discharge. Left eye exhibits no discharge. No scleral icterus.  Neck: Neck supple. No tracheal deviation present.  Cardiovascular: Normal rate.  Pulmonary/Chest: Effort normal. No stridor. No respiratory distress.  Abdominal: She exhibits no distension.  Musculoskeletal: She exhibits edema and tenderness.       Right hand: She exhibits tenderness and bony tenderness.  Erythema and edema around the carpal, metacarpal region thumb  Neurological: She is alert. Cranial nerve deficit: no gross deficits.  Skin: Skin is warm and dry. No  rash noted.  Psychiatric: She has a normal mood and affect.  Nursing note and vitals reviewed.    ED Treatments / Results    Radiology Dg Hand Complete Right  Result Date: 05/06/2017 CLINICAL DATA:  Acute onset right hand swelling and erythema today. EXAM: RIGHT HAND - COMPLETE 3+ VIEW COMPARISON:  None. FINDINGS: There is no evidence of fracture or dislocation. There is no evidence of arthropathy or other focal bone lesions. 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 Soft tissues are unremarkable. IMPRESSION: Negative. Electronically Signed   By: Myles RosenthalJohn  Stahl  M.D.   On: 05/06/2017 19:15    Procedures Procedures (including critical care time)  Medications Ordered in ED Medications  cephALEXin (KEFLEX) capsule 500 mg (not administered)  ibuprofen (ADVIL,MOTRIN) tablet 600 mg (600 mg Oral Given 05/06/17 1934)     Initial Impression / Assessment and Plan / ED Course  I have reviewed the triage vital signs and the nursing notes.  Pertinent labs & imaging results that were available during my care of the patient were reviewed by me and considered in my medical decision making (see chart for details).   Patient presented to the emergency room with complaints of right hand pain.  She does not recall any trauma.  She denies any fevers or injuries.  No history of iritis type symptoms.  X-ray is reassuring.  Patient might have a early cellulitis considering the mild erythema.  I think an infectious arthritis is unlikely.  We will try her on a course of antibiotics and NSAIDs.  Discussed outpatient follow-up.  As well as warning signs and precautions Final Clinical Impressions(s) / ED Diagnoses   Final diagnoses:  Right hand pain    ED Discharge Orders        Ordered    cephALEXin (KEFLEX) 500 MG capsule  4 times daily     05/06/17 1943    ibuprofen (ADVIL,MOTRIN) 600 MG tablet  Every 6 hours PRN     05/06/17 1943       Linwood DibblesKnapp, Mekenna Finau, MD 05/06/17 1945

## 2017-05-14 DIAGNOSIS — B353 Tinea pedis: Secondary | ICD-10-CM | POA: Diagnosis not present

## 2017-05-14 DIAGNOSIS — B351 Tinea unguium: Secondary | ICD-10-CM | POA: Diagnosis not present

## 2017-06-14 ENCOUNTER — Emergency Department (HOSPITAL_BASED_OUTPATIENT_CLINIC_OR_DEPARTMENT_OTHER)
Admission: EM | Admit: 2017-06-14 | Discharge: 2017-06-14 | Disposition: A | Discharge status: Home / Self Care | Payer: BLUE CROSS/BLUE SHIELD | Attending: Emergency Medicine | Admitting: Emergency Medicine

## 2017-06-14 ENCOUNTER — Emergency Department (HOSPITAL_BASED_OUTPATIENT_CLINIC_OR_DEPARTMENT_OTHER): Payer: BLUE CROSS/BLUE SHIELD

## 2017-06-14 ENCOUNTER — Encounter (HOSPITAL_BASED_OUTPATIENT_CLINIC_OR_DEPARTMENT_OTHER): Payer: Self-pay | Admitting: *Deleted

## 2017-06-14 ENCOUNTER — Other Ambulatory Visit: Payer: Self-pay

## 2017-06-14 DIAGNOSIS — M25561 Pain in right knee: Secondary | ICD-10-CM

## 2017-06-14 DIAGNOSIS — Z79899 Other long term (current) drug therapy: Secondary | ICD-10-CM | POA: Insufficient documentation

## 2017-06-14 MED ORDER — TRAMADOL HCL 50 MG PO TABS: 50.0000 mg | ORAL_TABLET | Freq: Four times a day (QID) | ORAL | 0 refills | Status: DC | PRN

## 2017-06-14 MED FILL — traMADol HCL 50 MG TABS: 50 | 3 days supply | Qty: 13 | Fill #0

## 2017-06-14 NOTE — ED Provider Notes (Signed)
[facility] EMERGENCY DEPARTMENT Provider Note   CSN: [id number] Arrival date & time: 06/14/17  1144     History   Chief Complaint Chief Complaint  Patient presents with  . Knee Pain    HPI Tanya Kelly is a 52 y.o. female who presents for right knee pain and swelling x 4 days.   HPI Patient's knee has been throbbing and keeping her from getting sleep. Along with the throbbing she has a sensation of tightness. Aspirin and ibuprofen have not helped. Nor has thermogesic or biofreeze rub. She denies recent injury or increased activity. 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 The pain can radiate into the lower leg, so she is concerned that this could be an indication that her right leg is not getting enough blood flow. Denies popping or locking sensation, no sensation of leg giving way.   History reviewed. No pertinent past medical history.  There are no active problems to display for this patient.   Past Surgical History:  Procedure Laterality Date  . CESAREAN SECTION    . VARICOSE VEIN SURGERY      OB History    No data available       Home Medications    Prior to Admission medications   Medication Sig Start Date End Date Taking? Authorizing Provider  cephALEXin (KEFLEX) 500 MG capsule Take 1 capsule (500 mg total) by mouth 4 (four) times daily. 05/06/17   Linwood Dibbles, MD  cyclobenzaprine (FLEXERIL) 10 MG tablet Take 1 tablet (10 mg total) by mouth 3 (three) times daily as needed for muscle spasms. 05/19/16   Street, [place], PA-C  ibuprofen (ADVIL,MOTRIN) 600 MG tablet Take 1 tablet (600 mg total) by mouth every 6 (six) hours as needed. 05/06/17   Linwood Dibbles, MD  naproxen (NAPROSYN) 500 MG tablet Take 1 tablet (500 mg total) by mouth 2 (two) times daily with a meal. 05/19/16    Street, [place], PA-C  traMADol (ULTRAM) 50 MG tablet Take 1 tablet (50 mg total) by mouth every 6 (six) hours as needed. 06/14/17 06/14/18  Casey Burkitt, MD    Family History No family history on file.  Social History Social History   Tobacco Use  . Smoking status: Never Smoker  . Smokeless tobacco: Never Used  Substance Use Topics  . Alcohol use: Yes  . Drug use: No     Allergies   Codeine   Review of Systems Review of Systems  Constitutional: Negative for chills and fever.  HENT: Negative for congestion.   Eyes: Negative for pain.  Respiratory: Negative for cough and shortness of breath.   Cardiovascular: Negative for chest pain.  Gastrointestinal: Negative for abdominal pain.  Genitourinary: Negative for dysuria.  Musculoskeletal: Negative for back pain and myalgias.  Skin: Negative for wound.  Neurological: Negative for weakness and numbness.     Physical Exam Updated Vital Signs BP 131/73   Pulse 72   Temp 98.4 F (36.9 C) (Oral)   Resp 16   Ht 5\' 1"  (1.549 m)   Wt 86.2 kg (190 lb)   LMP 06/08/2017   SpO2 98%   BMI 35.90 kg/m   Physical Exam  Constitutional: She is oriented to person, place, and time. She appears well-developed and well-nourished. No distress.  Obese, pleasant  HENT:  Head: Normocephalic and atraumatic.  Eyes: EOM are normal.  Neck: Normal range of motion.  Cardiovascular: Normal rate and intact distal pulses.  Pulmonary/Chest: Effort normal.  Musculoskeletal: Normal range of motion.  Minimal fullness to medial aspect of right knee compared to right with no obvious effusion. Bilateral crepitus of knees. Minimal medial and lateral point joint line tenderness of right knee. Denies pain with patellar grind or McMurray's bilaterally.   Neurological: She is alert and oriented to person, place, and time. No sensory deficit.  Skin: Skin is warm and dry. No erythema.  Psychiatric: She has a normal mood and affect.  Vitals  reviewed.    ED Treatments / Results  Labs (all labs ordered are listed, but only abnormal results are displayed) Labs Reviewed - No data to display  EKG  EKG Interpretation None       Radiology Dg Knee Complete 4 Views Right  Result Date: 06/14/2017 CLINICAL DATA:  RIGHT knee pain. EXAM: RIGHT KNEE - COMPLETE 4+ VIEW COMPARISON:  None. FINDINGS: No evidence of fracture or dislocation. Joint space narrowing of the medial and patellofemoral compartment. Moderate effusion. Quadriceps tendon spur. IMPRESSION: No acute fracture.  DJD.  Moderate effusion. Electronically Signed   By: Elsie StainJohn T Curnes M.D.   On: 06/14/2017 12:22    Procedures Procedures (including critical care time)  Medications Ordered in ED Medications - No data to display   Initial Impression / Assessment and Plan / ED Course  I have reviewed the triage vital signs and the nursing notes.  Pertinent labs & imaging results that were available during my care of the patient were reviewed by me and considered in my medical decision making (see chart for details).  Patient without evidence of infection -- no fever, no overlying skin redness of knee.   Patient able to ambulate without assistance with good range of motion and no obvious effusion on exam. Decided not to tap effusion to avoid risk of infection.   Final Clinical Impressions(s) / ED Diagnoses   Final diagnoses:  Acute pain of right knee   Increased right knee pain in setting of DJD. No infectious symptoms. Ambulating without assistance. Provided rx for tramadol for pain. Patient to rest, ice, elevate and use compression (has sleeve at home). Recommended follow-up with Sports Medicine. May benefit from steroid injection, counseling on therapies to increase quad strength.   ED Discharge Orders        Ordered    traMADol (ULTRAM) 50 MG tablet  Every 6 hours PRN     06/14/17 1240     Dani GobbleHillary Luretha Eberly, MD [facility], PGY-3      Casey BurkittFitzgerald, Eliah Marquard Moen, MD 06/14/17 1514    Maia PlanLong, Joshua G, MD 06/14/17 [id number]

## 2017-06-14 NOTE — Discharge Instructions (Signed)
Tanya Kelly,  You have some fluid within the knee and evidence of arthritis. While you have pain, elevate your right leg as much as possible, ice it, rest, and use compression sleeve when able. Take tramadol for pain and ibuprofen. Please call Sports Medicine Vincenza Hews(Shane Hudnall at [phone]) for appointment to address ongoing knee pain. You may want to consider an injection. He could also review quadriceps strengthening exercises to hopefully reduce frequency of knee pain flares.

## 2017-06-14 NOTE — ED Triage Notes (Signed)
Right knee swelling x 4 days.

## 2017-06-15 ENCOUNTER — Ambulatory Visit (INDEPENDENT_AMBULATORY_CARE_PROVIDER_SITE_OTHER): Payer: BLUE CROSS/BLUE SHIELD | Admitting: Family Medicine

## 2017-06-15 ENCOUNTER — Encounter: Payer: Self-pay | Admitting: Family Medicine

## 2017-06-15 DIAGNOSIS — M25561 Pain in right knee: Secondary | ICD-10-CM | POA: Diagnosis not present

## 2017-06-15 MED ORDER — DICLOFENAC SODIUM 75 MG PO TBEC: 75.0000 mg | DELAYED_RELEASE_TABLET | Freq: Two times a day (BID) | ORAL | 1 refills | Status: DC

## 2017-06-15 NOTE — Patient Instructions (Signed)
Your pain is due to a severe flare of arthritis. These are the different medications you can take for this: Tylenol 500mg  1-2 tabs three times a day for pain. Capsaicin, aspercreme, or biofreeze topically up to four times a day may also help with pain. Some supplements that may help for arthritis: Boswellia extract, curcumin, pycnogenol Start voltaren 75mg  twice a day with food for pain and inflammation. Cortisone injections are an option - let me know if you're not improving and want to try one of these. If cortisone injections do not help, there are different types of shots that may help but they take longer to take effect. It's important that you continue to stay active. Straight leg raises, knee extensions 3 sets of 10 once a day (add ankle weight if these become too easy). Consider physical therapy to strengthen muscles around the joint that hurts to take pressure off of the joint itself. Shoe inserts with good arch support may be helpful. Heat or ice 15 minutes at a time 3-4 times a day as needed to help with pain. Water aerobics and cycling with low resistance are the best two types of exercise for arthritis though any exercise is ok as long as it doesn't worsen the pain. Follow up with me in 1 month.

## 2017-06-15 NOTE — Progress Notes (Signed)
PCP: Patient, No Pcp Per  Subjective:   HPI: Patient is a 52 y.o. female here for right knee pain.  Patient reports she bumped her knees about 2 years ago at work - were bruised but seemed to do ok. Have bothered her since on occasion and much worse since 2/4. 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 No acute injury but pain is throbbing in right knee anteriorly up to 7/10 level. Tried aspirin, ibuprofen, biofreeze, a thermogesic cream. Pain radiates down to foot with aching. Worse at night. No skin changes, numbness. Some swelling at times including when in ED.  History reviewed. No pertinent past medical history.  Current Outpatient Medications on File Prior to Visit  Medication Sig Dispense Refill  . terbinafine (LAMISIL) 250 MG tablet Take by mouth.    Marland Kitchen. aspirin EC 81 MG tablet Take 81 mg by mouth.    . cyclobenzaprine (FLEXERIL) 10 MG tablet Take 1 tablet (10 mg total) by mouth 3 (three) times daily as needed for muscle spasms. 15 tablet 0  . econazole nitrate 1 % cream APP EXT AA BID  0  . traMADol (ULTRAM) 50 MG tablet Take 1 tablet (50 mg total) by mouth every 6 (six) hours as needed. 13 tablet 0   No current facility-administered medications on file prior to visit.     Past Surgical History:  Procedure Laterality Date  . CESAREAN SECTION    . VARICOSE VEIN SURGERY      Allergies  Allergen Reactions  . Codeine     migraines    Social History   Socioeconomic History  . Marital status: Widowed    Spouse name: Not on file  . Number of children: Not on file  . Years of education: Not on file  . Highest education level: Not on file  Social Needs  . Financial resource strain: Not on file  . Food insecurity - worry: Not on file  . Food insecurity - inability: Not on file  . Transportation needs - medical: Not on file  . Transportation needs - non-medical: Not on file  Occupational History  . Not on file  Tobacco Use  . Smoking status: Never  Smoker  . Smokeless tobacco: Never Used  Substance and Sexual Activity  . Alcohol use: Yes  . Drug use: No  . Sexual activity: Not on file  Other Topics Concern  . Not on file  Social History Narrative  . Not on file    History reviewed. No pertinent family history.  BP (!) 134/95   Pulse 69   Ht 5\' 1"  (1.549 m)   Wt 190 lb (86.2 kg)   LMP 06/08/2017   BMI 35.90 kg/m   Review of Systems: See HPI above.     Objective:  Physical Exam:  Gen: NAD, comfortable in exam room  Right knee: No gross deformity, ecchymoses, effusion. TTP post patellar facets, less medial joint line.  No other tenderness. FROM with 5/5 strength. Negative ant/post drawers. Negative valgus/varus testing. Negative lachmanns. Negative mcmurrays, apleys, patellar apprehension. NV intact distally.  Left knee: No deformity. FROM with 5/5 strength. No tenderness to palpation. NVI distally.   Assessment & Plan:  1. Right knee pain - independently reviewed radiographs and noted moderate arthritis.  Pain and exam consistent with this.  Discussed tylenol, topical medications, supplements.  Try voltaren twice a day.  Consider injection if not improving.  Shown home exercises to do daily.  F/u in 1  month.

## 2017-06-15 NOTE — Assessment & Plan Note (Signed)
independently reviewed radiographs and noted moderate arthritis.  Pain and exam consistent with this.  Discussed tylenol, topical medications, supplements.  Try voltaren twice a day.  Consider injection if not improving.  Shown home exercises to do daily.  F/u in 1 month.

## 2017-07-13 ENCOUNTER — Encounter: Payer: Self-pay | Admitting: Family Medicine

## 2017-07-13 ENCOUNTER — Ambulatory Visit (INDEPENDENT_AMBULATORY_CARE_PROVIDER_SITE_OTHER): Payer: BLUE CROSS/BLUE SHIELD | Admitting: Family Medicine

## 2017-07-13 DIAGNOSIS — M25561 Pain in right knee: Secondary | ICD-10-CM

## 2017-07-13 MED ORDER — METHYLPREDNISOLONE ACETATE 40 MG/ML IJ SUSP
40.0000 mg | Freq: Once | INTRAMUSCULAR | Status: AC
  Administered 2017-07-13: 40 mg via INTRA_ARTICULAR

## 2017-07-13 NOTE — Progress Notes (Signed)
PCP: Patient, No Pcp Per  Subjective:   HPI: Patient is a 52 y.o. female here for right knee pain.  2/8: Patient reports she bumped her knees about 2 years ago at work - were bruised but seemed to do ok. Have bothered her since on occasion and much worse since 2/4. 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 No acute injury but pain is throbbing in right knee anteriorly up to 7/10 level. Tried aspirin, ibuprofen, biofreeze, a thermogesic cream. Pain radiates down to foot with aching. Worse at night. No skin changes, numbness. Some swelling at times including when in ED.  3/8: Patient reports she feels about the same. Pain still at 7/10 level, sharp anteriorly. Worse with walking and at night. Worse with stiffness with prolonged sitting and first thing in the morning. No skin changes, numbness. Taking voltaren twice a day with food.  History reviewed. No pertinent past medical history.  Current Outpatient Medications on File Prior to Visit  Medication Sig Dispense Refill  . aspirin EC 81 MG tablet Take 81 mg by mouth.    . cyclobenzaprine (FLEXERIL) 10 MG tablet Take 1 tablet (10 mg total) by mouth 3 (three) times daily as needed for muscle spasms. 15 tablet 0  . diclofenac (VOLTAREN) 75 MG EC tablet Take 1 tablet (75 mg total) by mouth 2 (two) times daily. 60 tablet 1  . econazole nitrate 1 % cream APP EXT AA BID  0  . terbinafine (LAMISIL) 250 MG tablet Take by mouth.    . traMADol (ULTRAM) 50 MG tablet Take 1 tablet (50 mg total) by mouth every 6 (six) hours as needed. 13 tablet 0   No current facility-administered medications on file prior to visit.     Past Surgical History:  Procedure Laterality Date  . CESAREAN SECTION    . VARICOSE VEIN SURGERY      Allergies  Allergen Reactions  . Codeine     migraines    Social History   Socioeconomic History  . Marital status: Widowed    Spouse name: Not on file  . Number of children: Not on file  .  Years of education: Not on file  . Highest education level: Not on file  Social Needs  . Financial resource strain: Not on file  . Food insecurity - worry: Not on file  . Food insecurity - inability: Not on file  . Transportation needs - medical: Not on file  . Transportation needs - non-medical: Not on file  Occupational History  . Not on file  Tobacco Use  . Smoking status: Never Smoker  . Smokeless tobacco: Never Used  Substance and Sexual Activity  . Alcohol use: Yes  . Drug use: No  . Sexual activity: Not on file  Other Topics Concern  . Not on file  Social History Narrative  . Not on file    History reviewed. No pertinent family history.  BP (!) 149/92   Pulse 67   Ht 5\' 1"  (1.549 m)   Wt 190 lb (86.2 kg)   BMI 35.90 kg/m   Review of Systems: See HPI above.     Objective:  Physical Exam:  Gen: NAD, comfortable in exam room.  Right knee: No gross deformity, ecchymoses, swelling. No TTP. FROM with 5/5 strength flexion/extension. Negative ant/post drawers. Negative valgus/varus testing. Negative lachmanns. Negative mcmurrays, apleys, patellar apprehension. NV intact distally.   Assessment & Plan:  1. Right knee pain - Noted moderate  arthritis on radiographs previously.  Intraarticular injection given today.  Tylenol, voltaren, topical medications, supplements reviewed.  Home exercises.  F/u in 1 month.  After informed written consent timeout was performed, patient was seated on exam table. Right knee was prepped with alcohol swab and utilizing anteromedial approach, patient's right knee was injected intraarticularly with 3:1 bupivicaine: depomedrol. Patient tolerated the procedure well without immediate complications.

## 2017-07-13 NOTE — Assessment & Plan Note (Signed)
Noted moderate arthritis on radiographs previously.  Intraarticular injection given today.  Tylenol, voltaren, topical medications, supplements reviewed.  Home exercises.  F/u in 1 month.  After informed written consent timeout was performed, patient was seated on exam table. Right knee was prepped with alcohol swab and utilizing anteromedial approach, patient's right knee was injected intraarticularly with 3:1 bupivicaine: depomedrol. Patient tolerated the procedure well without immediate complications.

## 2017-07-13 NOTE — Patient Instructions (Signed)
Your pain is due to a severe flare of arthritis. These are the different medications you can take for this: Tylenol 500mg  1-2 tabs three times a day for pain. Capsaicin, aspercreme, or biofreeze topically up to four times a day may also help with pain. Some supplements that may help for arthritis: Boswellia extract, curcumin, pycnogenol Voltaren 75mg  twice a day with food for pain and inflammation. Cortisone injections are an option - you were given one of these today. If cortisone injections do not help, there are different types of shots that may help but they take longer to take effect. It's important that you continue to stay active. Straight leg raises, knee extensions 3 sets of 10 once a day (add ankle weight if these become too easy). Consider physical therapy to strengthen muscles around the joint that hurts to take pressure off of the joint itself. Shoe inserts with good arch support may be helpful. Heat or ice 15 minutes at a time 3-4 times a day as needed to help with pain. Water aerobics and cycling with low resistance are the best two types of exercise for arthritis though any exercise is ok as long as it doesn't worsen the pain. Follow up with me in 1 month.

## 2017-08-13 ENCOUNTER — Ambulatory Visit (INDEPENDENT_AMBULATORY_CARE_PROVIDER_SITE_OTHER): Payer: BLUE CROSS/BLUE SHIELD | Admitting: Family Medicine

## 2017-08-13 ENCOUNTER — Encounter: Payer: Self-pay | Admitting: Family Medicine

## 2017-08-13 DIAGNOSIS — M25561 Pain in right knee: Secondary | ICD-10-CM | POA: Diagnosis not present

## 2017-08-13 NOTE — Progress Notes (Signed)
PCP: Patient, No Pcp Per  Subjective:   HPI: Patient is a 52 y.o. female here for right knee pain.  2/8: Patient reports she bumped her knees about 2 years ago at work - were bruised but seemed to do ok. Have bothered her since on occasion and much worse since 2/4. 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 No acute injury but pain is throbbing in right knee anteriorly up to 7/10 level. Tried aspirin, ibuprofen, biofreeze, a thermogesic cream. Pain radiates down to foot with aching. Worse at night. No skin changes, numbness. Some swelling at times including when in ED.  3/8: Patient reports she feels about the same. Pain still at 7/10 level, sharp anteriorly. Worse with walking and at night. Worse with stiffness with prolonged sitting and first thing in the morning. No skin changes, numbness. Taking voltaren twice a day with food.  4/8: Patient reports she feels much better. Injection helped a lot. Pain is 0/10. [place] been able [place] and [place]. Not taking any medications for this. No skin changes.  History reviewed. No pertinent past medical history.  Current Outpatient Medications on File Prior to Visit  Medication Sig Dispense Refill  . aspirin EC 81 MG tablet Take 81 mg by mouth.    . cyclobenzaprine (FLEXERIL) 10 MG tablet Take 1 tablet (10 mg total) by mouth 3 (three) times daily as needed for muscle spasms. 15 tablet 0  . diclofenac (VOLTAREN) 75 MG EC tablet Take 1 tablet (75 mg total) by mouth 2 (two) times daily. 60 tablet 1  . econazole nitrate 1 % cream APP EXT AA BID  0  . terbinafine (LAMISIL) 250 MG tablet Take by mouth.    . traMADol (ULTRAM) 50 MG tablet Take 1 tablet (50 mg total) by mouth every 6 (six) hours as needed. 13 tablet 0   No current facility-administered medications on file prior to visit.     Past Surgical History:  Procedure Laterality Date  . CESAREAN SECTION    . VARICOSE VEIN SURGERY      Allergies   Allergen Reactions  . Codeine     migraines    Social History   Socioeconomic History  . Marital status: Widowed    Spouse name: Not on file  . Number of children: Not on file  . Years of education: Not on file  . Highest education level: Not on file  Occupational History  . Not on file  Social Needs  . Financial resource strain: Not on file  . Food insecurity:    Worry: Not on file    Inability: Not on file  . Transportation needs:    Medical: Not on file    Non-medical: Not on file  Tobacco Use  . Smoking status: Never Smoker  . Smokeless tobacco: Never Used  Substance and Sexual Activity  . Alcohol use: Yes  . Drug use: No  . Sexual activity: Not on file  Lifestyle  . Physical activity:    Days per week: Not on file    Minutes per session: Not on file  . Stress: Not on file  Relationships  . Social connections:    Talks on phone: Not on file    Gets together: Not on file    Attends religious service: Not on file    Active member of club or organization: Not on file    Attends meetings of clubs or organizations: Not on file  Relationship status: Not on file  . Intimate partner violence:    Fear of current or ex partner: Not on file    Emotionally abused: Not on file    Physically abused: Not on file    Forced sexual activity: Not on file  Other Topics Concern  . Not on file  Social History Narrative  . Not on file    History reviewed. No pertinent family history.  BP 124/86   Pulse 64   Ht 5\' 1"  (1.549 m)   Wt 193 lb (87.5 kg)   BMI 36.47 kg/m   Review of Systems: See HPI above.     Objective:  Physical Exam:  Gen: NAD, comfortable in exam room.  Right knee: No gross deformity, ecchymoses, swelling. No TTP. FROM. Negative ant/post drawers. Negative valgus/varus testing. Negative lachmanns. Negative mcmurrays, apleys, patellar apprehension. NV intact distally.   Assessment & Plan:  1. Right knee pain - 2/2 flare of moderate  arthritis.  Much improved with injection.  Tylenol, voltaren if needed.  F/u prn.

## 2017-08-13 NOTE — Assessment & Plan Note (Signed)
2/2 flare of moderate arthritis.  Much improved with injection.  Tylenol, voltaren if needed.  F/u prn.

## 2017-08-13 NOTE — Patient Instructions (Signed)
Call me if you need anything otherwise follow up as needed.

## 2017-09-12 DIAGNOSIS — B351 Tinea unguium: Secondary | ICD-10-CM | POA: Diagnosis not present

## 2017-09-12 DIAGNOSIS — B353 Tinea pedis: Secondary | ICD-10-CM | POA: Diagnosis not present

## 2017-10-03 DIAGNOSIS — Z01419 Encounter for gynecological examination (general) (routine) without abnormal findings: Secondary | ICD-10-CM | POA: Diagnosis not present

## 2017-10-19 DIAGNOSIS — Z0000 Encounter for general adult medical examination without abnormal findings: Secondary | ICD-10-CM | POA: Diagnosis not present

## 2017-10-19 DIAGNOSIS — Z1329 Encounter for screening for other suspected endocrine disorder: Secondary | ICD-10-CM | POA: Diagnosis not present

## 2017-10-19 DIAGNOSIS — Z1231 Encounter for screening mammogram for malignant neoplasm of breast: Secondary | ICD-10-CM | POA: Diagnosis not present

## 2017-10-19 DIAGNOSIS — Z1211 Encounter for screening for malignant neoplasm of colon: Secondary | ICD-10-CM | POA: Diagnosis not present

## 2017-10-20 DIAGNOSIS — Z13220 Encounter for screening for lipoid disorders: Secondary | ICD-10-CM | POA: Diagnosis not present

## 2017-10-20 DIAGNOSIS — Z1329 Encounter for screening for other suspected endocrine disorder: Secondary | ICD-10-CM | POA: Diagnosis not present

## 2017-10-20 DIAGNOSIS — Z136 Encounter for screening for cardiovascular disorders: Secondary | ICD-10-CM | POA: Diagnosis not present

## 2017-10-26 DIAGNOSIS — Z1231 Encounter for screening mammogram for malignant neoplasm of breast: Secondary | ICD-10-CM | POA: Diagnosis not present

## 2018-01-01 ENCOUNTER — Ambulatory Visit (INDEPENDENT_AMBULATORY_CARE_PROVIDER_SITE_OTHER): Payer: BLUE CROSS/BLUE SHIELD | Admitting: Family Medicine

## 2018-01-01 ENCOUNTER — Encounter: Payer: Self-pay | Admitting: Family Medicine

## 2018-01-01 DIAGNOSIS — M25562 Pain in left knee: Secondary | ICD-10-CM | POA: Diagnosis not present

## 2018-01-01 MED ORDER — METHYLPREDNISOLONE ACETATE 40 MG/ML IJ SUSP
40.0000 mg | Freq: Once | INTRAMUSCULAR | Status: AC
  Administered 2018-01-01: 40 mg via INTRA_ARTICULAR

## 2018-01-01 NOTE — Patient Instructions (Signed)

## 2018-01-02 ENCOUNTER — Encounter: Payer: Self-pay | Admitting: Family Medicine

## 2018-01-02 NOTE — Progress Notes (Signed)
PCP: Patient, No Pcp Per  Subjective:   HPI: Patient is a 52 y.o. female here for left knee pain.  Patient reports for about 2 weeks she's had fairly severe medial left knee pain. Pain level 10/10 and sharp. Difficulty sleeping at night due to pain. Feels like going to give out as well. Tried topical muscle rub, biofreeze, heat that helped only temporarily. No skin changes, numbness.  History reviewed. No pertinent past medical history.  Current Outpatient Medications on File Prior to Visit  Medication Sig Dispense Refill  . aspirin EC 81 MG tablet Take 81 mg by mouth.    . cyclobenzaprine (FLEXERIL) 10 MG tablet Take 1 tablet (10 mg total) by mouth 3 (three) times daily as needed for muscle spasms. 15 tablet 0  . diclofenac (VOLTAREN) 75 MG EC tablet Take 1 tablet (75 mg total) by mouth 2 (two) times daily. 60 tablet 1  . econazole nitrate 1 % cream APP EXT AA BID  0  . traMADol (ULTRAM) 50 MG tablet Take 1 tablet (50 mg total) by mouth every 6 (six) hours as needed. 13 tablet 0   No current facility-administered medications on file prior to visit.     Past Surgical History:  Procedure Laterality Date  . CESAREAN SECTION    . VARICOSE VEIN SURGERY      Allergies  Allergen Reactions  . Codeine     migraines    Social History   Socioeconomic History  . Marital status: Widowed    Spouse name: Not on file  . Number of children: Not on file  . Years of education: Not on file  . Highest education level: Not on file  Occupational History  . Not on file  Social Needs  . Financial resource strain: Not on file  . Food insecurity:    Worry: Not on file    Inability: Not on file  . Transportation needs:    Medical: Not on file    Non-medical: Not on file  Tobacco Use  . Smoking status: Never Smoker  . Smokeless tobacco: Never Used  Substance and Sexual Activity  . Alcohol use: Yes  . Drug use: No  . Sexual activity: Not on file  Lifestyle  . Physical activity:    Days per week: Not on file    Minutes per session: Not on file  . Stress: Not on file  Relationships  . Social connections:    Talks on phone: Not on file    Gets together: Not on file    Attends religious service: Not on file    Active member of club or organization: Not on file    Attends meetings of clubs or organizations: Not on file    Relationship status: Not on file  . Intimate partner violence:    Fear of current or ex partner: Not on file    Emotionally abused: Not on file    Physically abused: Not on file    Forced sexual activity: Not on file  Other Topics Concern  . Not on file  Social History Narrative  . Not on file    History reviewed. No pertinent family history.  BP 131/83   Pulse 67   Ht 5\' 1"  (1.549 m)   Wt 190 lb (86.2 kg)   BMI 35.90 kg/m   Review of Systems: See HPI above.     Objective:  Physical Exam:  Gen: NAD, comfortable in exam room  Left knee: No gross deformity, ecchymoses,  swelling. TTP medial joint line.  No other tenderness. FROM with 5/5 strength flexion and extension. Negative ant/post drawers. Negative valgus/varus testing. Negative lachmanns. Negative mcmurrays, apleys, patellar apprehension. NV intact distally.  Right knee: No deformity. FROM with 5/5 strength. No tenderness to palpation. NVI distally.   Assessment & Plan:  1. Left knee pain - 2/2 flare of arthritis.  Discussed tylenol, topical medications, supplements that may help, aleve.  Intraarticular injection given today.  Shown home exercises to do daily.  Heat or ice.  F/u in 1 month or prn.  After informed written consent timeout was performed, patient was seated on exam table. Left knee was prepped with alcohol swab and utilizing anteromedial approach, patient's left knee was injected intraarticularly with 3:1 bupivicaine: depomedrol. Patient tolerated the procedure well without immediate complications.

## 2018-01-21 ENCOUNTER — Ambulatory Visit: Payer: BLUE CROSS/BLUE SHIELD | Admitting: Family Medicine

## 2018-01-23 ENCOUNTER — Ambulatory Visit (INDEPENDENT_AMBULATORY_CARE_PROVIDER_SITE_OTHER): Payer: BLUE CROSS/BLUE SHIELD | Admitting: Family Medicine

## 2018-01-23 ENCOUNTER — Encounter: Payer: Self-pay | Admitting: Family Medicine

## 2018-01-23 VITALS — BP 177/103 | HR 62 | Ht 61.0 in | Wt 194.0 lb

## 2018-01-23 DIAGNOSIS — M25562 Pain in left knee: Secondary | ICD-10-CM

## 2018-01-23 MED ORDER — MELOXICAM 15 MG PO TABS: 15.0000 mg | ORAL_TABLET | Freq: Every day | ORAL | 2 refills | Status: DC

## 2018-01-23 NOTE — Patient Instructions (Signed)
I'd expect you to have more relief with the shot if this was just arthritis - we will go ahead with an MRI of your worse left knee. These are the different medications you can take for this: Tylenol 500mg  1-2 tabs three times a day for pain. Capsaicin, aspercreme, or biofreeze topically up to four times a day may also help with pain. Some supplements that may help for arthritis: Boswellia extract, curcumin, pycnogenol Take meloxicam 15 mg daily with food for pain and inflammation. It's important that you continue to stay active. Straight leg raises, knee extensions 3 sets of 10 once a day (add ankle weight if these become too easy). Consider physical therapy to strengthen muscles around the joint that hurts to take pressure off of the joint itself. Shoe inserts with good arch support may be helpful. Heat or ice 15 minutes at a time 3-4 times a day as needed to help with pain. Water aerobics and cycling with low resistance are the best two types of exercise for arthritis though any exercise is ok as long as it doesn't worsen the pain.

## 2018-01-27 ENCOUNTER — Encounter: Payer: Self-pay | Admitting: Family Medicine

## 2018-01-27 NOTE — Progress Notes (Signed)
PCP: Patient, No Pcp Per  Subjective:   HPI: Patient is a 52 y.o. female here for left knee pain.  8/27: Patient reports for about 2 weeks she's had fairly severe medial left knee pain. Pain level 10/10 and sharp. Difficulty sleeping at night due to pain. Feels like going to give out as well. Tried topical muscle rub, biofreeze, heat that helped only temporarily. No skin changes, numbness.  9/18: Patient reports she had minimal relief with intraarticular injection left knee. Pain is 10/10 and sharp, worse when on her feet. + swelling. Has been icing but not taking other medications, doing some exercises - can't find anything to help with the pain. Also with some anterior right knee pain at 7/10 level. No skin changes, numbness.  History reviewed. No pertinent past medical history.  Current Outpatient Medications on File Prior to Visit  Medication Sig Dispense Refill  . aspirin EC 81 MG tablet Take 81 mg by mouth.    . cyclobenzaprine (FLEXERIL) 10 MG tablet Take 1 tablet (10 mg total) by mouth 3 (three) times daily as needed for muscle spasms. 15 tablet 0  . diclofenac (VOLTAREN) 75 MG EC tablet Take 1 tablet (75 mg total) by mouth 2 (two) times daily. 60 tablet 1  . econazole nitrate 1 % cream APP EXT AA BID  0  . traMADol (ULTRAM) 50 MG tablet Take 1 tablet (50 mg total) by mouth every 6 (six) hours as needed. 13 tablet 0   No current facility-administered medications on file prior to visit.     Past Surgical History:  Procedure Laterality Date  . CESAREAN SECTION    . VARICOSE VEIN SURGERY      Allergies  Allergen Reactions  . Codeine     migraines    Social History   Socioeconomic History  . Marital status: Widowed    Spouse name: Not on file  . Number of children: Not on file  . Years of education: Not on file  . Highest education level: Not on file  Occupational History  . Not on file  Social Needs  . Financial resource strain: Not on file  . Food  insecurity:    Worry: Not on file    Inability: Not on file  . Transportation needs:    Medical: Not on file    Non-medical: Not on file  Tobacco Use  . Smoking status: Never Smoker  . Smokeless tobacco: Never Used  Substance and Sexual Activity  . Alcohol use: Yes  . Drug use: No  . Sexual activity: Not on file  Lifestyle  . Physical activity:    Days per week: Not on file    Minutes per session: Not on file  . Stress: Not on file  Relationships  . Social connections:    Talks on phone: Not on file    Gets together: Not on file    Attends religious service: Not on file    Active member of club or organization: Not on file    Attends meetings of clubs or organizations: Not on file    Relationship status: Not on file  . Intimate partner violence:    Fear of current or ex partner: Not on file    Emotionally abused: Not on file    Physically abused: Not on file    Forced sexual activity: Not on file  Other Topics Concern  . Not on file  Social History Narrative  . Not on file    History  reviewed. No pertinent family history.  BP (!) 177/103   Pulse 62   Ht 5\' 1"  (1.549 m)   Wt 194 lb (88 kg)   BMI 36.66 kg/m   Review of Systems: See HPI above.     Objective:  Physical Exam:  Gen: NAD, comfortable in exam room  Left knee: No gross deformity, ecchymoses, effusion TTP medial joint line.  No other tenderness. FROM with 5/5 strength. Negative ant/post drawers. Negative valgus/varus testing. Negative lachmans. Mild pain mcmurrays.  Negative apleys, patellar apprehension. NV intact distally.  Right knee: No gross deformity, ecchymoses, swelling. Mild TTP medial and lateral joint lines. FROM with 5/5 strength. Negative ant/post drawers. Negative valgus/varus testing. Negative lachmans. Negative mcmurrays, apleys, patellar apprehension. NV intact distally.    Assessment & Plan:  1. Left knee pain - not improving with intraarticular injection as would expect  for arthritis.  Concern that she may have concurrent meniscus tear or arthritis is more severe than seen on prior radiographs.  Will go ahead with MRI.  Tylenol, topical medications, supplements reviewed.  Try meloxicam.  Home exercises.  Heat or ice.  2. Right knee pain - combination of arthritis flare and compensatory pain.  Medications as noted above.

## 2018-02-01 ENCOUNTER — Ambulatory Visit: Payer: BLUE CROSS/BLUE SHIELD | Admitting: Family Medicine

## 2018-02-02 ENCOUNTER — Emergency Department (HOSPITAL_BASED_OUTPATIENT_CLINIC_OR_DEPARTMENT_OTHER)
Admission: EM | Admit: 2018-02-02 | Discharge: 2018-02-02 | Disposition: A | Discharge status: Home / Self Care | Payer: BLUE CROSS/BLUE SHIELD | Attending: Emergency Medicine | Admitting: Emergency Medicine

## 2018-02-02 ENCOUNTER — Other Ambulatory Visit: Payer: Self-pay

## 2018-02-02 ENCOUNTER — Encounter (HOSPITAL_BASED_OUTPATIENT_CLINIC_OR_DEPARTMENT_OTHER): Payer: Self-pay | Admitting: *Deleted

## 2018-02-02 DIAGNOSIS — Z7982 Long term (current) use of aspirin: Secondary | ICD-10-CM | POA: Insufficient documentation

## 2018-02-02 DIAGNOSIS — Z79899 Other long term (current) drug therapy: Secondary | ICD-10-CM | POA: Diagnosis not present

## 2018-02-02 DIAGNOSIS — I10 Essential (primary) hypertension: Secondary | ICD-10-CM | POA: Insufficient documentation

## 2018-02-02 LAB — CBC WITH DIFFERENTIAL/PLATELET
Basophils Absolute: 0 10*3/uL (ref 0.0–0.1)
Basophils Relative: 0 %
EOS ABS: 0.1 10*3/uL (ref 0.0–0.7)
Eosinophils Relative: 2 %
HEMATOCRIT: 37.7 % (ref 36.0–46.0)
Hemoglobin: 12.1 g/dL (ref 12.0–15.0)
Lymphocytes Relative: 31 %
Lymphs Abs: 1.7 10*3/uL (ref 0.7–4.0)
MCH: 26.4 pg (ref 26.0–34.0)
MCHC: 32.1 g/dL (ref 30.0–36.0)
MCV: 82.1 fL (ref 78.0–100.0)
Monocytes Absolute: 0.5 10*3/uL (ref 0.1–1.0)
Monocytes Relative: 9 %
NEUTROS ABS: 3.1 10*3/uL (ref 1.7–7.7)
Neutrophils Relative %: 58 %
Platelets: 225 10*3/uL (ref 150–400)
RBC: 4.59 MIL/uL (ref 3.87–5.11)
RDW: 14.8 % (ref 11.5–15.5)
WBC: 5.4 10*3/uL (ref 4.0–10.5)

## 2018-02-02 LAB — BASIC METABOLIC PANEL
Anion gap: 8 (ref 5–15)
BUN: 15 mg/dL (ref 6–20)
CALCIUM: 8.9 mg/dL (ref 8.9–10.3)
CO2: 26 mmol/L (ref 22–32)
CREATININE: 0.65 mg/dL (ref 0.44–1.00)
Chloride: 103 mmol/L (ref 98–111)
GFR calc Af Amer: >60 mL/min (ref >60)
Glucose, Bld: 101 mg/dL — ABNORMAL HIGH (ref 70–99)
Potassium: 3.6 mmol/L (ref 3.5–5.1)
SODIUM: 137 mmol/L (ref 135–145)

## 2018-02-02 LAB — TROPONIN I

## 2018-02-02 MED ORDER — AMLODIPINE BESYLATE 5 MG PO TABS
5.0000 mg | ORAL_TABLET | Freq: Once | ORAL | Status: AC
  Administered 2018-02-02: 5 mg via ORAL
  Filled 2018-02-02: qty 1

## 2018-02-02 MED ORDER — ONDANSETRON HCL 4 MG/2ML IJ SOLN
4.0000 mg | Freq: Once | INTRAMUSCULAR | Status: AC
  Administered 2018-02-02: 4 mg via INTRAVENOUS
  Filled 2018-02-02: qty 2

## 2018-02-02 MED ORDER — AMLODIPINE BESYLATE 5 MG PO TABS: 5.0000 mg | ORAL_TABLET | Freq: Every day | ORAL | 0 refills | Status: DC

## 2018-02-02 NOTE — ED Provider Notes (Signed)
[facility] Provider Note: [name], MD, FACEP  CSN: [id number] MRN: [id number] ARRIVAL: 02/02/18 at 0122 ROOM: [facility]   CHIEF COMPLAINT  Hypertension   HISTORY OF PRESENT ILLNESS  02/02/18 2:23 AM Tanya Kelly is a 52 y.o. female who had a checkup with her PCP on the 18th of this month.  It was noted at that time her blood pressure was elevated.  Since then she has been taking her blood pressure every night.  She has noticed it has been elevated, as high as 171/103.  It was 155/90 on arrival here.  She has been having moderate intermittent headaches which are associated with a throbbing sensation in her right arm.  She is also had nausea.  She denies chest pain or shortness of breath.  She denies blurred vision.  She is not on any antihypertensives.   History reviewed. No pertinent past medical history.  Past Surgical History:  Procedure Laterality Date  . CESAREAN SECTION    . VARICOSE VEIN SURGERY      History reviewed. No pertinent family history.  Social History   Tobacco Use  . Smoking status: Never Smoker  . Smokeless tobacco: Never Used  Substance Use Topics  . Alcohol use: Yes  . Drug use: No    Prior to Admission medications   Medication Sig Start Date End Date Taking? Authorizing Provider  aspirin EC 81 MG tablet Take 81 mg by mouth.    [provider]  cyclobenzaprine (FLEXERIL) 10 MG tablet Take 1 tablet (10 mg total) by mouth 3 (three) times daily as needed for muscle spasms. 05/19/16   Street, [place], PA-C  diclofenac (VOLTAREN) 75 MG EC tablet Take 1 tablet (75 mg total) by mouth 2 (two) times daily. 06/15/17   Lenda Kelp, MD  econazole nitrate 1 % cream APP EXT AA BID 05/15/17   [provider]  meloxicam (MOBIC) 15 MG tablet Take 1 tablet (15 mg total) by mouth daily. 01/23/18   Hudnall, Azucena Fallen, MD  traMADol (ULTRAM) 50 MG tablet Take 1 tablet (50 mg total) by mouth every 6 (six) hours as needed. 06/14/17 06/14/18   Casey Burkitt, MD    Allergies Codeine   REVIEW OF SYSTEMS  Negative except as noted here or in the History of Present Illness.   PHYSICAL EXAMINATION  Initial Vital Signs Blood pressure (!) 155/90, pulse 66, temperature 98.2 F (36.8 C), resp. rate 18, height 5\' 1"  (1.549 m), weight 87.9 kg, last menstrual period 01/13/2018, SpO2 100 %.  Examination General: Well-developed, well-nourished female in no acute distress; appearance consistent with age of record HENT: normocephalic; atraumatic Eyes: pupils equal, round and reactive to light; extraocular muscles intact Neck: supple Heart: regular rate and rhythm Lungs: clear to auscultation bilaterally Abdomen: soft; nondistended; nontender; bowel sounds present Extremities: No deformity; full range of motion; pulses normal Neurologic: Awake, alert and oriented; motor function intact in all extremities and symmetric; no facial droop Skin: Warm and dry Psychiatric: Flat affect   RESULTS  Summary of this visit's results, reviewed by myself:   EKG Interpretation  Date/Time:    Ventricular Rate:    PR Interval:    QRS Duration:   QT Interval:    QTC Calculation:   R Axis:     Text Interpretation:        Laboratory Studies: Results for orders placed or performed during the hospital encounter of 02/02/18 (from the past 24 hour(s))  Basic metabolic panel  Status: Abnormal   Collection Time: 02/02/18  2:30 AM  Result Value Ref Range   Sodium 137 135 - 145 mmol/L   Potassium 3.6 3.5 - 5.1 mmol/L   Chloride 103 98 - 111 mmol/L   CO2 26 22 - 32 mmol/L   Glucose, Bld 101 (H) 70 - 99 mg/dL   BUN 15 6 - 20 mg/dL   Creatinine, Ser 1.61 0.44 - 1.00 mg/dL   Calcium 8.9 8.9 - 09.6 mg/dL   GFR calc non Af Amer >60 >60 mL/min   GFR calc Af Amer >60 >60 mL/min   Anion gap 8 5 - 15  Troponin I     Status: None   Collection Time: 02/02/18  2:30 AM  Result Value Ref Range   Troponin I <0.03 <0.03 ng/mL  CBC with  Differential/Platelet     Status: None   Collection Time: 02/02/18  2:30 AM  Result Value Ref Range   WBC 5.4 4.0 - 10.5 K/uL   RBC 4.59 3.87 - 5.11 MIL/uL   Hemoglobin 12.1 12.0 - 15.0 g/dL   HCT 04.5 40.9 - 81.1 %   MCV 82.1 78.0 - 100.0 fL   MCH 26.4 26.0 - 34.0 pg   MCHC 32.1 30.0 - 36.0 g/dL   RDW 91.4 78.2 - 95.6 %   Platelets 225 150 - 400 K/uL   Neutrophils Relative % 58 %   Lymphocytes Relative 31 %   Monocytes Relative 9 %   Eosinophils Relative 2 %   Basophils Relative 0 %   Neutro Abs 3.1 1.7 - 7.7 K/uL   Lymphs Abs 1.7 0.7 - 4.0 K/uL   Monocytes Absolute 0.5 0.1 - 1.0 K/uL   Eosinophils Absolute 0.1 0.0 - 0.7 K/uL   Basophils Absolute 0.0 0.0 - 0.1 K/uL   RBC Morphology ELLIPTOCYTES    Imaging Studies: No results found.  ED COURSE and MDM  Nursing notes and initial vitals signs, including pulse oximetry, reviewed.  Vitals:   02/02/18 0131 02/02/18 0132  BP: (!) 155/90   Pulse: 66   Resp: 18   Temp:  98.2 F (36.8 C)  TempSrc: Oral   SpO2: 100%   Weight: 87.9 kg   Height: 5\' 1"  (1.549 m)    3:00 AM We will start patient on Norvasc 5 mg daily and have her follow-up with her PCP.  PROCEDURES    ED DIAGNOSES     ICD-10-CM   1. Hypertension not at goal I10        Costa Jha, Jonny Ruiz, MD 02/02/18 0300

## 2018-02-02 NOTE — ED Triage Notes (Signed)
Pt c/o increased BP and h/a x 3 days

## 2018-02-09 ENCOUNTER — Ambulatory Visit
Admission: RE | Admit: 2018-02-09 | Discharge: 2018-02-09 | Disposition: A | Discharge status: Home / Self Care | Payer: BLUE CROSS/BLUE SHIELD | Source: Ambulatory Visit | Attending: Family Medicine | Admitting: Family Medicine

## 2018-02-09 DIAGNOSIS — M25562 Pain in left knee: Secondary | ICD-10-CM | POA: Diagnosis not present

## 2018-02-12 NOTE — Addendum Note (Signed)
Addended by: Kathi Simpers F on: 02/12/2018 02:48 PM   Modules accepted: Orders

## 2018-02-26 DIAGNOSIS — L700 Acne vulgaris: Secondary | ICD-10-CM | POA: Diagnosis not present

## 2018-02-27 ENCOUNTER — Encounter: Payer: Self-pay | Admitting: Physical Therapy

## 2018-02-27 ENCOUNTER — Other Ambulatory Visit: Payer: Self-pay

## 2018-02-27 ENCOUNTER — Ambulatory Visit: Payer: BLUE CROSS/BLUE SHIELD | Attending: Family Medicine | Admitting: Physical Therapy

## 2018-02-27 DIAGNOSIS — M25662 Stiffness of left knee, not elsewhere classified: Secondary | ICD-10-CM | POA: Diagnosis not present

## 2018-02-27 DIAGNOSIS — M6281 Muscle weakness (generalized): Secondary | ICD-10-CM | POA: Diagnosis not present

## 2018-02-27 DIAGNOSIS — M25562 Pain in left knee: Secondary | ICD-10-CM | POA: Diagnosis not present

## 2018-02-27 DIAGNOSIS — R262 Difficulty in walking, not elsewhere classified: Secondary | ICD-10-CM

## 2018-02-27 NOTE — Therapy (Signed)
[facility] Outpatient Rehabilitation [facility] [address]  [address] [place], [place], [address] Phone: [phone]   Fax:  [phone]  Physical Therapy Evaluation  Patient Details  Name: Tanya Kelly MRN: [id number] Date of Birth: [birth year] Referring Provider (PT): Norton Blizzard, MD   Encounter Date: 02/27/2018  PT End of Session - 02/27/18 1054    Visit Number  1    Number of Visits  13    Date for PT Re-Evaluation  04/10/18    Authorization Type  BCBS    PT Start Time  1016    PT Stop Time  1053    PT Time Calculation (min)  37 min    Activity Tolerance  Patient tolerated treatment well    Behavior During Therapy  [facility] for tasks assessed/performed       History reviewed. No pertinent past medical history.  Past Surgical History:  Procedure Laterality Date  . CESAREAN SECTION    . VARICOSE VEIN SURGERY      There were no vitals filed for this visit.   Subjective Assessment - 02/27/18 1023    Subjective  Patient reports R knee pain started 6 months ago- had an accident at work where she hit the bumper of a bus with her knee. 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 Had injection to R knee and pain improved. Started having L knee pain 2 months ago- insidious onset. Had injection but did not have relief for more than a week. Currently L still bothering her. Pain located superior to patella and intermittently in posterior knee.  Denies N/T but reports intermittent sharp pain up leg. Also reporting intermittent ankle swelling. Aggravating factors: walking and feeling or locking up in knee with walking, feeling of knee coming out of place with certain movements, worse in the AM, getting up after sitting for a long time. Easing factors: icy-hot type gel.     Pertinent History  HTN, arthritis, back pain    Limitations  Sitting;Lifting;Standing;Walking;House hold activities    How long can you sit comfortably?  unlimited    How long can you stand comfortably?   stands 8 hours at work    How long can you walk comfortably?  30 min    Diagnostic tests  02/09/18 L knee MRI: Negative for meniscal or ligament tear.  No internal derangement. Mild osteoarthritis about the knee.    Patient Stated Goals  get better at walking and exercising     Currently in Pain?  Yes    Pain Score  5     Pain Location  Knee    Pain Orientation  Left   above patella   Pain Descriptors / Indicators  Aching    Pain Type  Acute pain         [facility] PT Assessment - 02/27/18 1032      Assessment   Medical Diagnosis  L knee pain    Referring Provider (PT)  Norton Blizzard, MD    Onset Date/Surgical Date  12/28/17    Next MD Visit  03/06/18    Prior Therapy  No      Precautions   Precautions  None      Restrictions   Weight Bearing Restrictions  No      Balance Screen   Has the patient fallen in the past 6 months  No    Has the patient had a decrease in activity level because of a fear of falling?   No  Is the patient reluctant to leave their home because of a fear of falling?   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    Leisure  walking, exercise      Cognition   Overall Cognitive Status  Within Functional Limits for tasks assessed      Observation/Other Assessments   Focus on Therapeutic Outcomes (FOTO)   Knee: 55 (45% limited, 38% predicted)      Sensation   Light Touch  Appears Intact      Coordination   Gross Motor Movements are Fluid and Coordinated  Yes      Posture/Postural Control   Posture/Postural  Control  Postural limitations    Postural Limitations  Rounded Shoulders;Forward head      ROM / Strength   AROM / PROM / Strength  AROM;PROM;Strength      AROM   AROM Assessment Site  Knee    Right/Left Knee  Right;Left    Right Knee Extension  0    Right Knee Flexion  117    Left Knee Extension  1    Left Knee Flexion  114      PROM   PROM Assessment Site  Knee    Right/Left Knee  Right;Left    Right Knee Extension  -1    Right Knee Flexion  123   mild pain   Left Knee Extension  -1    Left Knee Flexion  117   pain     Strength   Strength Assessment Site  Hip;Knee;Ankle    Right/Left Hip  Right;Left    Right Hip Flexion  4+/5    Right Hip ABduction  4+/5    Right Hip ADduction  4/5    Left Hip Flexion  4+/5    Left Hip ABduction  4+/5    Left Hip ADduction  4/5    Right/Left Knee  Right;Left    Right Knee Flexion  4/5    Right Knee Extension  4/5    Left Knee Flexion  4/5    Left Knee Extension  4/5    Right/Left Ankle  Right;Left    Right Ankle Dorsiflexion  4+/5    Right Ankle Plantar Flexion  4+/5    Left Ankle Dorsiflexion  4+/5    Left Ankle Plantar Flexion  4+/5      Flexibility   Soft Tissue Assessment /Muscle Length  yes    Hamstrings  L mild tightness    Quadriceps  L mod-severe tightness with mod thomas      Palpation   Patella mobility  B patellar mobility WFL, pain with sup/inf glides on L     Palpation comment  L suprapatellar region mildly TTP and edematous      Ambulation/Gait   Gait Pattern  Step-through pattern;Decreased hip/knee flexion - left;Decreased weight shift to left;Decreased stance time - left;Decreased step length - right;Lateral trunk lean to left;Lateral  trunk lean to right    Ambulation Surface  Level;Indoor    Gait velocity  slightly decreased                Objective measurements completed on examination: See above findings.              PT Education - 02/27/18 1054    Education Details  prognosis,  POC, HEP    Person(s) Educated  Patient    Methods  Explanation;Demonstration;Tactile cues;Verbal cues;Handout    Comprehension  Verbalized understanding;Returned demonstration       PT Short Term Goals - 02/27/18 1214      PT SHORT TERM GOAL #1   Title  Patient to be independent with initial HEP.    Time  3    Period  Weeks    Status  New    Target Date  03/20/18        PT Long Term Goals - 02/27/18 1214      PT LONG TERM GOAL #1   Title  Patient to be independent with advanced HEP.    Time  6    Period  Weeks    Status  New    Target Date  04/10/18      PT LONG TERM GOAL #2   Title  Patient to demonstrate [facility] and pain free L knee ROM.    Time  6    Period  Weeks    Status  New    Target Date  04/10/18      PT LONG TERM GOAL #3   Title  Patient to demonstrate >=4+/5 strength in B LEs.    Time  6    Period  Weeks    Status  New    Target Date  04/10/18      PT LONG TERM GOAL #4   Title  Patient to report tolerance of 1 hour of walking without pain limiting.    Time  6    Period  Weeks    Status  New    Target Date  04/10/18      PT LONG TERM GOAL #5   Title  Patient to report 50% improvement in pain level when standing up from prolonged sitting.     Time  6    Period  Weeks    Status  New    Target Date  04/10/18             Plan - 02/27/18 1208    Clinical Impression Statement  Patient is a 52y/o F presenting to OPPT with c/o L knee pain of 2 months duration. Patient notes insidious onset with minimal relief from injection. Pain located superior to patella and intermittently in posterior knee.  Denies N/T but reports intermittent radiation of sharp pain up the anterior thigh. Aggravating factors include walking, worse in AM, standing up after prolonged sitting. Patient notes feeling of knee coming out of place with certain movements. Today with limited and painful L ROM, LE weakness surrounding knees, tenderness in suprapatellar region and pain with  superior/inferior glides of R patella, decreased flexibility, and gait deviations. Educated on gentle stretching and strengthening HEP; patient reported understanding. Would benefit from skilled PT services 2x/week for 6 weeks to address aforementioned impairments.     Clinical Presentation  Stable    Clinical Decision Making  Low    Rehab Potential  Good    Clinical Impairments Affecting Rehab Potential  HTN, arthritis, back pain  PT Frequency  2x / week    PT Duration  6 weeks    PT Treatment/Interventions  ADLs/Self Care Home Management;Cryotherapy;Electrical Stimulation;Iontophoresis 4mg /ml Dexamethasone;Moist Heat;Ultrasound;Gait training;Stair training;Functional mobility training;Therapeutic activities;Therapeutic exercise;Manual techniques;Orthotic Fit/Training;Patient/family education;Neuromuscular re-education;Balance training;Passive range of motion;Dry needling;Energy conservation;Splinting;Taping;Vasopneumatic Device    PT Next Visit Plan  reassess HEP    Consulted and Agree with Plan of Care  Patient       Patient will benefit from skilled therapeutic intervention in order to improve the following deficits and impairments:  Decreased endurance, Increased edema, Decreased activity tolerance, Decreased strength, Pain, Difficulty walking, Decreased range of motion, Postural dysfunction, Impaired flexibility  Visit Diagnosis: Acute pain of left knee  Stiffness of left knee, not elsewhere classified  Muscle weakness (generalized)  Difficulty in walking, not elsewhere classified     Problem List Patient Active Problem List   Diagnosis Date Noted  . Right knee pain 06/15/2017  . Onychomycosis due to dermatophyte 04/09/2017  . Tinea pedis of both feet 04/09/2017  . 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 Fuchs' corneal dystrophy 12/27/2015  . Leg pain 12/27/2015  . Macular retinal cyst of left eye 12/27/2015  . Migraine headache 12/27/2015  . Nuclear sclerotic  cataract of both eyes 12/27/2015     Anette Guarneri, PT, DPT 02/27/18 12:19 PM   [facility] Outpatient Rehabilitation [facility] [address]  [address] [place], [place], [address] Phone: [phone]   Fax:  [phone]  Name: Tanya Kelly MRN: [id number] Date of Birth: 02-25-1966

## 2018-03-06 ENCOUNTER — Ambulatory Visit: Payer: BLUE CROSS/BLUE SHIELD | Admitting: Physical Therapy

## 2018-03-06 DIAGNOSIS — M25562 Pain in left knee: Secondary | ICD-10-CM

## 2018-03-06 DIAGNOSIS — M25662 Stiffness of left knee, not elsewhere classified: Secondary | ICD-10-CM | POA: Diagnosis not present

## 2018-03-06 DIAGNOSIS — M6281 Muscle weakness (generalized): Secondary | ICD-10-CM

## 2018-03-06 DIAGNOSIS — R262 Difficulty in walking, not elsewhere classified: Secondary | ICD-10-CM

## 2018-03-06 NOTE — Therapy (Signed)
[facility] Outpatient Rehabilitation [facility] [address]  [address] [place], [place], [address] Phone: [phone]   Fax:  [phone]  Physical Therapy Treatment  Patient Details  Name: Tanya Kelly MRN: [id number] Date of Birth: [birth year] Referring Provider (PT): Norton Blizzard, MD   Encounter Date: 03/06/2018  PT End of Session - 03/06/18 1616    Visit Number  2    Number of Visits  13    Date for PT Re-Evaluation  04/10/18    Authorization Type  BCBS    PT Start Time  1450    PT Stop Time  1544    PT Time Calculation (min)  54 min    Activity Tolerance  Patient tolerated treatment well    Behavior During Therapy  [facility] for tasks assessed/performed       No past medical history on file.  Past Surgical History:  Procedure Laterality Date  . CESAREAN SECTION    . VARICOSE VEIN SURGERY      There were no vitals filed for this visit.  Subjective Assessment - 03/06/18 1611    Subjective  Pt relays more Rt knee pain laterally and posteriorly, her lt knee is not bothering her as much    Currently in Pain?  Yes    Pain Score  9     Pain Location  Knee    Pain Orientation  Right    Pain Descriptors / Indicators  Aching;Stabbing    Pain Type  Acute pain                       [facility] Adult PT Treatment/Exercise - 03/06/18 0001      Exercises   Exercises  Knee/Hip      Knee/Hip Exercises: Stretches   Active Hamstring Stretch  Right;Left;2 reps;30 seconds    Active Hamstring Stretch Limitations  supine with strap    Quad Stretch  Right;Left;2 reps;30 seconds    Quad Stretch Limitations  prone with strap      Knee/Hip Exercises: Aerobic   Nustep  L4 X 5 min      Knee/Hip Exercises: Seated   Long Arc Quad  Right;Left;10 reps    Long Arc Quad Limitations  with ball sq    Hamstring Curl  Right;Left;10 reps    Hamstring Limitations  red T-band      Knee/Hip Exercises: Supine   Bridges  10 reps   hold 5 sec   Straight Leg  Raises  Right;Left;10 reps   flexion     Modalities   Modalities  Electrical Stimulation;Moist Heat      Moist Heat Therapy   Number Minutes Moist Heat  15 Minutes    Moist Heat Location  Knee   bilat     Electrical Stimulation   Electrical Stimulation Location  bilat knees    Electrical Stimulation Action  pre mod    Electrical Stimulation Parameters  tolerance    Electrical Stimulation Goals  Pain      Manual Therapy   Manual therapy comments  STM/IASTM with roller stick to H.S IT band on Rt and quads bilat, gentle PROM, patella mobs,              PT Education - 03/06/18 1616    Education Details  HEP review    Person(s) Educated  Patient    Methods  Explanation;Demonstration;Verbal cues    Comprehension  Verbalized understanding;Returned demonstration       PT  Short Term Goals - 02/27/18 1214      PT SHORT TERM GOAL #1   Title  Patient to be independent with initial HEP.    Time  3    Period  Weeks    Status  New    Target Date  03/20/18        PT Long Term Goals - 02/27/18 1214      PT LONG TERM GOAL #1   Title  Patient to be independent with advanced HEP.    Time  6    Period  Weeks    Status  New    Target Date  04/10/18      PT LONG TERM GOAL #2   Title  Patient to demonstrate [facility] and pain free L knee ROM.    Time  6    Period  Weeks    Status  New    Target Date  04/10/18      PT LONG TERM GOAL #3   Title  Patient to demonstrate >=4+/5 strength in B LEs.    Time  6    Period  Weeks    Status  New    Target Date  04/10/18      PT LONG TERM GOAL #4   Title  Patient to report tolerance of 1 hour of walking without pain limiting.    Time  6    Period  Weeks    Status  New    Target Date  04/10/18      PT LONG TERM GOAL #5   Title  Patient to report 50% improvement in pain level when standing up from prolonged sitting.     Time  6    Period  Weeks    Status  New    Target Date  04/10/18            Plan - 03/06/18 1617     Clinical Impression Statement  HEP was reviewed with pt where she showed good understanding and return demonstration. She left her T-band last visit so has not been doing hamstring curls. She was given another band this session. She had good tolerance to session and did get some pain relief after manual therapy and TENS with heat.     Clinical Impairments Affecting Rehab Potential  HTN, arthritis, back pain    PT Frequency  2x / week    PT Duration  6 weeks    PT Treatment/Interventions  ADLs/Self Care Home Management;Cryotherapy;Electrical Stimulation;Iontophoresis 4mg /ml Dexamethasone;Moist Heat;Ultrasound;Gait training;Stair training;Functional mobility training;Therapeutic activities;Therapeutic exercise;Manual techniques;Orthotic Fit/Training;Patient/family education;Neuromuscular re-education;Balance training;Passive range of motion;Dry needling;Energy conservation;Splinting;Taping;Vasopneumatic Device    PT Next Visit Plan  LE strength and stretching, MT and modalties PRN    Consulted and Agree with Plan of Care  Patient       Patient will benefit from skilled therapeutic intervention in order to improve the following deficits and impairments:  Decreased endurance, Increased edema, Decreased activity tolerance, Decreased strength, Pain, Difficulty walking, Decreased range of motion, Postural dysfunction, Impaired flexibility  Visit Diagnosis: Acute pain of left knee  Stiffness of left knee, not elsewhere classified  Muscle weakness (generalized)  Difficulty in walking, not elsewhere classified     Problem List Patient Active Problem List   Diagnosis Date Noted  . Right knee pain 06/15/2017  . Onychomycosis due to dermatophyte 04/09/2017  . Tinea pedis of both feet 04/09/2017  . 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 Fuchs' corneal dystrophy 12/27/2015  .  Leg pain 12/27/2015  . Macular retinal cyst of left eye 12/27/2015  . Migraine headache 12/27/2015  . Nuclear  sclerotic cataract of both eyes 12/27/2015    April Manson, PT, DPT 03/06/2018, 4:20 PM  [facility] [address]  [address] [place], [place], [address] Phone: [phone]   Fax:  [phone]  Name: Tanya Kelly MRN: [id number] Date of Birth: 1966/04/16

## 2018-03-08 ENCOUNTER — Encounter: Payer: Self-pay | Admitting: Physical Therapy

## 2018-03-08 ENCOUNTER — Ambulatory Visit: Payer: BLUE CROSS/BLUE SHIELD | Attending: Family Medicine | Admitting: Physical Therapy

## 2018-03-08 DIAGNOSIS — R262 Difficulty in walking, not elsewhere classified: Secondary | ICD-10-CM

## 2018-03-08 DIAGNOSIS — M6281 Muscle weakness (generalized): Secondary | ICD-10-CM | POA: Diagnosis not present

## 2018-03-08 DIAGNOSIS — M25562 Pain in left knee: Secondary | ICD-10-CM | POA: Diagnosis not present

## 2018-03-08 DIAGNOSIS — M25662 Stiffness of left knee, not elsewhere classified: Secondary | ICD-10-CM | POA: Diagnosis not present

## 2018-03-08 NOTE — Therapy (Signed)
[facility] Outpatient Rehabilitation [facility] [address]  [address] [place], [place], [address] Phone: [phone]   Fax:  [phone]  Physical Therapy Treatment  Patient Details  Name: Tanya Kelly MRN: [id number] Date of Birth: July 19, 1965 Referring Provider (PT): Norton Blizzard, MD   Encounter Date: 03/08/2018  PT End of Session - 03/08/18 1012    Visit Number  3    Number of Visits  13    Date for PT Re-Evaluation  04/10/18    Authorization Type  BCBS    PT Start Time  0930    PT Stop Time  1018   moist heat   PT Time Calculation (min)  48 min    Activity Tolerance  Patient tolerated treatment well    Behavior During Therapy  [facility] for tasks assessed/performed       History reviewed. No pertinent past medical history.  Past Surgical History:  Procedure Laterality Date  . CESAREAN SECTION    . VARICOSE VEIN SURGERY      There were no vitals filed for this visit.  Subjective Assessment - 03/08/18 0929    Subjective  Reports at night time her legs stiffen up. Wants to talk to Dr. Pearletha Forge to get paperwork to be released back to work. L knee is getting better but R knee is hurting her today.     Pertinent History  HTN, arthritis, back pain    Diagnostic tests  02/09/18 L knee MRI: Negative for meniscal or ligament tear.  No internal derangement. Mild osteoarthritis about the knee.    Patient Stated Goals  get better at walking and exercising     Currently in Pain?  Yes    Pain Score  7     Pain Location  Knee    Pain Orientation  Right    Pain Descriptors / Indicators  Sharp    Pain Type  Acute pain                       [facility] Adult PT Treatment/Exercise - 03/08/18 0001      Exercises   Exercises  Knee/Hip      Knee/Hip Exercises: Stretches   Passive Hamstring Stretch  Right;Left;1 rep;30 seconds    Passive Hamstring Stretch Limitations  supine strap    Hip Flexor Stretch  Right;Left;1 rep;30 seconds;Limitations    Hip  Flexor Stretch Limitations  supine mod thomas with strap      Knee/Hip Exercises: Aerobic   Recumbent Bike  L2 x      Knee/Hip Exercises: Standing   Terminal Knee Extension  Strengthening;Right;Left;1 set;10 reps;Theraband    Theraband Level (Terminal Knee Extension)  Level 4 (Blue)    Terminal Knee Extension Limitations  1 chair support; cues for 3 sec hold      Knee/Hip Exercises: Seated   Hamstring Curl  Right;Left;10 reps;Strengthening;Limitations    Hamstring Limitations  green TB; red TB "too easy"    Sit to Sand  10 reps;without UE support   STS with 1 foam pad touching bottom down     Knee/Hip Exercises: Supine   Bridges  Strengthening;Both;1 set;10 reps;Limitations    Bridges Limitations  HS bias    Bridges with [employer]  Strengthening;Both;10 reps;2 sets   cues to increase adductor contraction   Straight Leg Raises  Right;Left;10 reps   good form   Straight Leg Raises Limitations  2#      Moist Heat Therapy  Number Minutes Moist Heat  10 Minutes    Moist Heat Location  Knee   B knees in hooklying            PT Education - 03/08/18 1012    Education Details  update to HEP; administered blue and green TB    Person(s) Educated  Patient    Methods  Explanation;Demonstration;Tactile cues;Verbal cues;Handout    Comprehension  Verbalized understanding;Returned demonstration       PT Short Term Goals - 03/08/18 1012      PT SHORT TERM GOAL #1   Title  Patient to be independent with initial HEP.    Time  3    Period  Weeks    Status  On-going        PT Long Term Goals - 03/08/18 1012      PT LONG TERM GOAL #1   Title  Patient to be independent with advanced HEP.    Time  6    Period  Weeks    Status  On-going      PT LONG TERM GOAL #2   Title  Patient to demonstrate [facility] and pain free L knee ROM.    Time  6    Period  Weeks    Status  On-going      PT LONG TERM GOAL #3   Title  Patient to demonstrate >=4+/5 strength in B LEs.    Time   6    Period  Weeks    Status  On-going      PT LONG TERM GOAL #4   Title  Patient to report tolerance of 1 hour of walking without pain limiting.    Time  6    Period  Weeks    Status  On-going      PT LONG TERM GOAL #5   Title  Patient to report 50% improvement in pain level when standing up from prolonged sitting.     Time  6    Period  Weeks    Status  On-going            Plan - 03/08/18 1214    Clinical Impression Statement  Patient arrived to session with report that R knee is bothering her more than L today. Progressed SLR with 2lb weight with good form. Introduced bridge with hamstring bias with patient tolerating this well. Required cues to increase adduction contraction with bridge with ball squeeze. Progressed hamstring curl with green band as patient reporting red band was not challenging enough. Administered green band to perform this at home. Patient with great form during STS touching bottom on 1 foam pad- report of muscle burn and mild c/o B knee pain but tolerable. Updated HEP to include TKE as this was tolerated well today. Ended session with moist heat to B knees as patient reporting this gives her relief. No complaints at end of session.     Clinical Impairments Affecting Rehab Potential  HTN, arthritis, back pain    PT Treatment/Interventions  ADLs/Self Care Home Management;Cryotherapy;Electrical Stimulation;Iontophoresis 4mg /ml Dexamethasone;Moist Heat;Ultrasound;Gait training;Stair training;Functional mobility training;Therapeutic activities;Therapeutic exercise;Manual techniques;Orthotic Fit/Training;Patient/family education;Neuromuscular re-education;Balance training;Passive range of motion;Dry needling;Energy conservation;Splinting;Taping;Vasopneumatic Device    Consulted and Agree with Plan of Care  Patient       Patient will benefit from skilled therapeutic intervention in order to improve the following deficits and impairments:  Decreased endurance,  Increased edema, Decreased activity tolerance, Decreased strength, Pain, Difficulty walking, Decreased range of motion, Postural dysfunction, Impaired flexibility  Visit Diagnosis: Acute pain of left knee  Stiffness of left knee, not elsewhere classified  Muscle weakness (generalized)  Difficulty in walking, not elsewhere classified     Problem List Patient Active Problem List   Diagnosis Date Noted  . Right knee pain 06/15/2017  . Onychomycosis due to dermatophyte 04/09/2017  . Tinea pedis of both feet 04/09/2017  . 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 Fuchs' corneal dystrophy 12/27/2015  . Leg pain 12/27/2015  . Macular retinal cyst of left eye 12/27/2015  . Migraine headache 12/27/2015  . Nuclear sclerotic cataract of both eyes 12/27/2015    Anette Guarneri, PT, DPT 03/08/18 12:16 PM   [facility] Outpatient Rehabilitation [facility] [address]  [address] [place], [place], [address] Phone: [phone]   Fax:  [phone]  Name: Tanya Kelly MRN: [id number] Date of Birth: 11-28-1965

## 2018-03-11 ENCOUNTER — Encounter: Payer: Self-pay | Admitting: Physical Therapy

## 2018-03-11 ENCOUNTER — Encounter: Payer: Self-pay | Admitting: Family Medicine

## 2018-03-11 ENCOUNTER — Ambulatory Visit (INDEPENDENT_AMBULATORY_CARE_PROVIDER_SITE_OTHER): Payer: BLUE CROSS/BLUE SHIELD | Admitting: Family Medicine

## 2018-03-11 ENCOUNTER — Ambulatory Visit: Payer: BLUE CROSS/BLUE SHIELD | Admitting: Physical Therapy

## 2018-03-11 VITALS — BP 141/83 | HR 58 | Ht 61.0 in | Wt 195.0 lb

## 2018-03-11 DIAGNOSIS — R262 Difficulty in walking, not elsewhere classified: Secondary | ICD-10-CM | POA: Diagnosis not present

## 2018-03-11 DIAGNOSIS — M25562 Pain in left knee: Secondary | ICD-10-CM | POA: Diagnosis not present

## 2018-03-11 DIAGNOSIS — G8929 Other chronic pain: Secondary | ICD-10-CM | POA: Diagnosis not present

## 2018-03-11 DIAGNOSIS — M6281 Muscle weakness (generalized): Secondary | ICD-10-CM | POA: Diagnosis not present

## 2018-03-11 DIAGNOSIS — M25662 Stiffness of left knee, not elsewhere classified: Secondary | ICD-10-CM | POA: Diagnosis not present

## 2018-03-11 DIAGNOSIS — M25561 Pain in right knee: Secondary | ICD-10-CM

## 2018-03-11 MED ORDER — METHYLPREDNISOLONE ACETATE 40 MG/ML IJ SUSP
40.0000 mg | Freq: Once | INTRAMUSCULAR | Status: AC
  Administered 2018-03-11: 40 mg via INTRA_ARTICULAR

## 2018-03-11 NOTE — Patient Instructions (Signed)
Your pain is due to a severe flare of arthritis. These are the different medications you can take for this: Tylenol 500mg  1-2 tabs three times a day for pain. Capsaicin, aspercreme, or biofreeze topically up to four times a day may also help with pain. Some supplements that may help for arthritis: Boswellia extract, curcumin, pycnogenol Aleve only if needed. Cortisone injections are an option - you were given one of these today. If cortisone injections do not help, there are different types of shots that may help but they take longer to take effect. It's important that you continue to stay active. Straight leg raises, knee extensions 3 sets of 10 once a day (add ankle weight if these become too easy). Continue physical therapy, transition to the home exercises. Shoe inserts with good arch support may be helpful. Heat or ice 15 minutes at a time 3-4 times a day as needed to help with pain. Water aerobics and cycling with low resistance are the best two types of exercise for arthritis though any exercise is ok as long as it doesn't worsen the pain. Follow up with me in 1 month or as needed.

## 2018-03-11 NOTE — Therapy (Signed)
[facility] [address]  [address] [place], [place], [address] Phone: [phone]   Fax:  [phone]  Physical Therapy Treatment  Patient Details  Name: Tanya Kelly MRN: [id number] Date of Birth: [birth year] Referring Provider (PT): Norton Blizzard, MD   Encounter Date: 03/11/2018  PT End of Session - 03/11/18 1701    Visit Number  4    Number of Visits  13    Date for PT Re-Evaluation  04/10/18    Authorization Type  BCBS    PT Start Time  1534    PT Stop Time  1612    PT Time Calculation (min)  38 min    Activity Tolerance  Patient tolerated treatment well    Behavior During Therapy  [facility] for tasks assessed/performed       History reviewed. No pertinent past medical history.  Past Surgical History:  Procedure Laterality Date  . CESAREAN SECTION    . VARICOSE VEIN SURGERY      There were no vitals filed for this visit.  Subjective Assessment - 03/11/18 1537    Subjective  Reports this AM she saw Dr. Pearletha Forge who gave her a shot in her R knee. Is not having pain right now.     Diagnostic tests  02/09/18 L knee MRI: Negative for meniscal or ligament tear.  No internal derangement. Mild osteoarthritis about the knee.    Patient Stated Goals  get better at walking and exercising     Currently in Pain?  No/denies                       [facility] Adult PT Treatment/Exercise - 03/11/18 0001      Exercises   Exercises  Knee/Hip      Knee/Hip Exercises: Stretches   Hip Flexor Stretch  Right;Left;30 seconds;Limitations;2 reps   cues to bend opposite knee   Hip Flexor Stretch Limitations  supine mod thomas with strap      Knee/Hip Exercises: Aerobic   Tread Mill  L1.5 x      Knee/Hip Exercises: Machines for Strengthening   Cybex Knee Extension  B LEs 10x 20#    Cybex Knee Flexion  B LEs 10x 15#   unable to tolerate 20#     Knee/Hip Exercises: Standing   Functional Squat  10 seconds;2 sets    Functional Squat Limitations  at counter top; 2nd set with red TB around knees; cues to avoid painful range    Other Standing Knee Exercises  sidestepping with red TB around knees 2x62ft   cues to correct anterior trunk lean     Knee/Hip Exercises: Supine   Bridges  Strengthening;Both;1 set;10 reps;Limitations    Bridges Limitations  B LEs straight on orange pball    Single Leg Bridge  Strengthening;Both;Limitations   bridge + alt march   Straight Leg Raises  Right;Left;10 reps    Straight Leg Raises Limitations  2#; no quad lag      Knee/Hip Exercises: Sidelying   Clams  10x each LE with red TB around knees   cues to avoid trunk rotation- good carryover            PT Education - 03/11/18 1701    Education Details  updated HEP    Person(s) Educated  Patient    Methods  Explanation;Demonstration;Tactile cues;Verbal cues;Handout    Comprehension  Verbalized understanding;Returned demonstration       PT Short Term Goals -  03/11/18 1705      PT SHORT TERM GOAL #1   Title  Patient to be independent with initial HEP.    Time  3    Period  Weeks    Status  Achieved        PT Long Term Goals - 03/08/18 1012      PT LONG TERM GOAL #1   Title  Patient to be independent with advanced HEP.    Time  6    Period  Weeks    Status  On-going      PT LONG TERM GOAL #2   Title  Patient to demonstrate [facility] and pain free L knee ROM.    Time  6    Period  Weeks    Status  On-going      PT LONG TERM GOAL #3   Title  Patient to demonstrate >=4+/5 strength in B LEs.    Time  6    Period  Weeks    Status  On-going      PT LONG TERM GOAL #4   Title  Patient to report tolerance of 1 hour of walking without pain limiting.    Time  6    Period  Weeks    Status  On-going      PT LONG TERM GOAL #5   Title  Patient to report 50% improvement in pain level when standing up from prolonged sitting.     Time  6    Period  Weeks    Status  On-going            Plan - 03/11/18  1702    Clinical Impression Statement  Patient arrived to session with report that she saw her MD who gave her an injection to R knee. Reports relief of pain for now. Patient tolerated progressive LE strengthening with intermittent cues required to correct form. Progressed bridges with marching with good form. Introduced clamshells with cues to avoid trunk rotation with good carryover. Updated HEP to include these exercises. Patient reported understanding. Patient with good form with squats today and tolerated addition of red banded resistance around knees. Ended session without c/o pain.     Clinical Impairments Affecting Rehab Potential  HTN, arthritis, back pain    PT Treatment/Interventions  ADLs/Self Care Home Management;Cryotherapy;Electrical Stimulation;Iontophoresis 4mg /ml Dexamethasone;Moist Heat;Ultrasound;Gait training;Stair training;Functional mobility training;Therapeutic activities;Therapeutic exercise;Manual techniques;Orthotic Fit/Training;Patient/family education;Neuromuscular re-education;Balance training;Passive range of motion;Dry needling;Energy conservation;Splinting;Taping;Vasopneumatic Device    Consulted and Agree with Plan of Care  Patient       Patient will benefit from skilled therapeutic intervention in order to improve the following deficits and impairments:  Decreased endurance, Increased edema, Decreased activity tolerance, Decreased strength, Pain, Difficulty walking, Decreased range of motion, Postural dysfunction, Impaired flexibility  Visit Diagnosis: Acute pain of left knee  Stiffness of left knee, not elsewhere classified  Muscle weakness (generalized)  Difficulty in walking, not elsewhere classified     Problem List Patient Active Problem List   Diagnosis Date Noted  . Right knee pain 06/15/2017  . Onychomycosis due to dermatophyte 04/09/2017  . Tinea pedis of both feet 04/09/2017  . 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 Fuchs' corneal  dystrophy 12/27/2015  . Leg pain 12/27/2015  . Macular retinal cyst of left eye 12/27/2015  . Migraine headache 12/27/2015  . Nuclear sclerotic cataract of both eyes 12/27/2015    Anette Guarneri, PT, DPT 03/11/18 5:06 PM   [facility]  [address]  [address] [place], [place], [address] Phone: [phone]   Fax:  [phone]  Name: Fabian Coca MRN: [id number] Date of Birth: 1966/02/03

## 2018-03-11 NOTE — Progress Notes (Signed)
PCP: Patient, No Pcp Per  Subjective:   HPI: Patient is a 52 y.o. female here for bilateral knee pain. Patient reports 0/10 pain in the left knee.  She has been in physical therapy and reports doing quite well with this. Patient also has right knee pain which she states is a 5/10.  She has had right knee pain on and off for about the past year but is increased over the last week.  She denies any specific injury.  She reports occasional swelling.  Her pain is worse at the end of the day or during increase activity, particularly when walking a lot of stairs.  She localizes her pain over the lateral aspect of the knee and reports some radiation posteriorly and down into the lower leg.  She continues to take Mobic which is helpful.  She also feels that physical therapy has been beneficial.  Approximately 6 months ago she had a steroid injection performed which gave her significant relief.  She denies any erythema or bruising.  No locking or giving out.  No associated skin changes.   No past medical history on file.  Current Outpatient Medications on File Prior to Visit  Medication Sig Dispense Refill  . amLODipine (NORVASC) 5 MG tablet Take 1 tablet (5 mg total) by mouth daily. 30 tablet 0  . aspirin EC 81 MG tablet Take 81 mg by mouth.    . meloxicam (MOBIC) 15 MG tablet Take 1 tablet (15 mg total) by mouth daily. 30 tablet 2  . traMADol (ULTRAM) 50 MG tablet Take 1 tablet (50 mg total) by mouth every 6 (six) hours as needed. 13 tablet 0   No current facility-administered medications on file prior to visit.     Past Surgical History:  Procedure Laterality Date  . CESAREAN SECTION    . VARICOSE VEIN SURGERY      Allergies  Allergen Reactions  . Codeine     migraines    Social History   Socioeconomic History  . Marital status: Widowed    Spouse name: Not on file  . Number of children: Not on file  . Years of education: Not on file  . Highest education level: Not on file   Occupational History  . Not on file  Social Needs  . Financial resource strain: Not on file  . Food insecurity:    Worry: Not on file    Inability: Not on file  . Transportation needs:    Medical: Not on file    Non-medical: Not on file  Tobacco Use  . Smoking status: Never Smoker  . Smokeless tobacco: Never Used  Substance and Sexual Activity  . Alcohol use: Yes  . Drug use: No  . Sexual activity: Not on file  Lifestyle  . Physical activity:    Days per week: Not on file    Minutes per session: Not on file  . Stress: Not on file  Relationships  . Social connections:    Talks on phone: Not on file    Gets together: Not on file    Attends religious service: Not on file    Active member of club or organization: Not on file    Attends meetings of clubs or organizations: Not on file    Relationship status: Not on file  . Intimate partner violence:    Fear of current or ex partner: Not on file    Emotionally abused: Not on file    Physically abused: Not on file  Forced sexual activity: Not on file  Other Topics Concern  . Not on file  Social History Narrative  . Not on file    No family history on file.  BP (!) 141/83   Pulse (!) 58   Ht 5\' 1"  (1.549 m)   Wt 195 lb (88.5 kg)   BMI 36.84 kg/m   Review of Systems: See HPI above.     Objective:  Physical Exam:  Gen: awake, alert, NAD, comfortable in exam room Pulm: breathing unlabored  Right knee: - Inspection: no gross deformity. No swelling/effusion, erythema or bruising. Skin intact - Palpation: Tenderness over the lateral joint line - ROM: full active ROM with flexion and extension in knee and hip - Strength: 5/5 strength - Neuro/vasc: NV intact - Special Tests: - LIGAMENTS: negative anterior and posterior drawer, negative Lachman's, no MCL or LCL laxity  -- MENISCUS: negative McMurray's -- PF JOINT: nml patellar mobility without apprehension  Left knee: - Inspection: no gross deformity. No  swelling/effusion, erythema or bruising. Skin intact - Palpation: no TTP - ROM: full active ROM with flexion and extension in knee and hip - Strength: 5/5 strength - Neuro/vasc: NV intact  Hips: No pain with passive IR/ER   Assessment & Plan:  1.  Right knee pain-secondary to arthritis.  No structural abnormality appreciated on exam.  She responded very well to steroid injection about 6 months ago so we will repeat this today. - Right knee intra-articular steroid injection - Continue with physical therapy -Mobic as needed for pain - Ice -Continue work restrictions for 1 more week - Follow-up in about 1 month or as needed  Procedure performed: knee intraarticular corticosteroid injection; palpation guided  Consent obtained and verified. Time-out conducted. Noted no overlying erythema, induration, or other signs of local infection. The  right lateraljoint space was palpated and marked. The overlying skin was prepped in a sterile fashion. Topical analgesic spray: Ethyl chloride. Joint: Right knee Needle: 1.5 inch, 25-gauge Completed without difficulty. Meds: Depo-Medrol 40 mg, bupivacaine 3 cc  Advised to call if fevers/chills, erythema, induration, drainage, or persistent bleeding.

## 2018-03-14 ENCOUNTER — Ambulatory Visit: Payer: BLUE CROSS/BLUE SHIELD | Admitting: Physical Therapy

## 2018-03-14 DIAGNOSIS — M25562 Pain in left knee: Secondary | ICD-10-CM

## 2018-03-14 DIAGNOSIS — M25662 Stiffness of left knee, not elsewhere classified: Secondary | ICD-10-CM

## 2018-03-14 DIAGNOSIS — M6281 Muscle weakness (generalized): Secondary | ICD-10-CM

## 2018-03-14 DIAGNOSIS — R262 Difficulty in walking, not elsewhere classified: Secondary | ICD-10-CM | POA: Diagnosis not present

## 2018-03-14 NOTE — Therapy (Signed)
[facility] [address]  [address] [place], [place], [address] Phone: [phone]   Fax:  [phone]  Physical Therapy Treatment  Patient Details  Name: Tanya Kelly MRN: [id number] Date of Birth: 05-22-1965 Referring Provider (PT): Norton Blizzard, MD   Encounter Date: 03/14/2018  PT End of Session - 03/14/18 1533    Visit Number  5    Number of Visits  13    Date for PT Re-Evaluation  04/10/18    Authorization Type  BCBS    PT Start Time  1530    PT Stop Time  1608    PT Time Calculation (min)  38 min    Activity Tolerance  Patient tolerated treatment well    Behavior During Therapy  [facility] for tasks assessed/performed       No past medical history on file.  Past Surgical History:  Procedure Laterality Date  . CESAREAN SECTION    . VARICOSE VEIN SURGERY      There were no vitals filed for this visit.  Subjective Assessment - 03/14/18 1532    Subjective  Pt reports her knee has doing better since injection and she has had some time off work.    Currently in Pain?  Yes    Pain Score  5     Pain Location  Knee    Pain Orientation  Posterior;Right    Pain Descriptors / Indicators  Aching                       [facility] Adult PT Treatment/Exercise - 03/14/18 0001      Knee/Hip Exercises: Stretches   Passive Hamstring Stretch  Right;2 reps;30 seconds    Hip Flexor Stretch  Right;Left;30 seconds;Limitations;2 reps    Hip Flexor Stretch Limitations  supine mod thomas with strap      Knee/Hip Exercises: Aerobic   Tread Mill  L1.5 mph x      Knee/Hip Exercises: Machines for Strengthening   Cybex Knee Extension  B LEs 15x 20#    Cybex Knee Flexion  2X10 15#      Knee/Hip Exercises: Standing   Functional Squat  10 seconds;2 sets    Functional Squat Limitations  at counter top; 2nd set with red TB around knees; cues to avoid painful range    Other Standing Knee Exercises  sidestepping with red  TB around knees 2x21ft      Knee/Hip Exercises: Supine   Bridges  Strengthening;Both;1 set;Limitations;15 reps    Bridges Limitations  B LEs straight on orange pball    Single Leg Bridge  Strengthening;Both;Limitations    Straight Leg Raises  Right;Left;15 reps    Straight Leg Raises Limitations  2#; no quad lag      Knee/Hip Exercises: Sidelying   Clams  15 ea with red TBand               PT Short Term Goals - 03/11/18 1705      PT SHORT TERM GOAL #1   Title  Patient to be independent with initial HEP.    Time  3    Period  Weeks    Status  Achieved        PT Long Term Goals - 03/08/18 1012      PT LONG TERM GOAL #1   Title  Patient to be independent with advanced HEP.    Time  6    Period  Weeks  Status  On-going      PT LONG TERM GOAL #2   Title  Patient to demonstrate [facility] and pain free L knee ROM.    Time  6    Period  Weeks    Status  On-going      PT LONG TERM GOAL #3   Title  Patient to demonstrate >=4+/5 strength in B LEs.    Time  6    Period  Weeks    Status  On-going      PT LONG TERM GOAL #4   Title  Patient to report tolerance of 1 hour of walking without pain limiting.    Time  6    Period  Weeks    Status  On-going      PT LONG TERM GOAL #5   Title  Patient to report 50% improvement in pain level when standing up from prolonged sitting.     Time  6    Period  Weeks    Status  On-going            Plan - 03/14/18 1604    Clinical Impression Statement  Pt able to progress reps with strengthening program today with good tolerance and without complaints. She has made good progress over last 2 weeks but will continue to benefit from PT to further build strength and endurance.     Rehab Potential  Good    PT Frequency  2x / week    PT Duration  6 weeks    PT Treatment/Interventions  ADLs/Self Care Home Management;Cryotherapy;Electrical Stimulation;Iontophoresis 4mg /ml Dexamethasone;Moist Heat;Ultrasound;Gait training;Stair  training;Functional mobility training;Therapeutic activities;Therapeutic exercise;Manual techniques;Orthotic Fit/Training;Patient/family education;Neuromuscular re-education;Balance training;Passive range of motion;Dry needling;Energy conservation;Splinting;Taping;Vasopneumatic Device    PT Next Visit Plan  LE strength and stretching, MT and modalties PRN    Consulted and Agree with Plan of Care  Patient       Patient will benefit from skilled therapeutic intervention in order to improve the following deficits and impairments:  Decreased endurance, Increased edema, Decreased activity tolerance, Decreased strength, Pain, Difficulty walking, Decreased range of motion, Postural dysfunction, Impaired flexibility  Visit Diagnosis: Acute pain of left knee  Stiffness of left knee, not elsewhere classified  Muscle weakness (generalized)  Difficulty in walking, not elsewhere classified     Problem List Patient Active Problem List   Diagnosis Date Noted  . Right knee pain 06/15/2017  . Onychomycosis due to dermatophyte 04/09/2017  . Tinea pedis of both feet 04/09/2017  . 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 Fuchs' corneal dystrophy 12/27/2015  . Leg pain 12/27/2015  . Macular retinal cyst of left eye 12/27/2015  . Migraine headache 12/27/2015  . Nuclear sclerotic cataract of both eyes 12/27/2015    April Manson, PT,DPT 03/14/2018, 4:06 PM  [facility] [address]  [address] [place], [place], [address] Phone: [phone]   Fax:  [phone]  Name: Keeva Reisen MRN: [id number] Date of Birth: 1965-09-27

## 2018-03-18 ENCOUNTER — Ambulatory Visit: Payer: BLUE CROSS/BLUE SHIELD | Admitting: Physical Therapy

## 2018-03-19 ENCOUNTER — Ambulatory Visit: Payer: BLUE CROSS/BLUE SHIELD | Admitting: Physical Therapy

## 2018-03-19 ENCOUNTER — Encounter: Payer: Self-pay | Admitting: Physical Therapy

## 2018-03-19 DIAGNOSIS — M6281 Muscle weakness (generalized): Secondary | ICD-10-CM | POA: Diagnosis not present

## 2018-03-19 DIAGNOSIS — R262 Difficulty in walking, not elsewhere classified: Secondary | ICD-10-CM | POA: Diagnosis not present

## 2018-03-19 DIAGNOSIS — M25562 Pain in left knee: Secondary | ICD-10-CM | POA: Diagnosis not present

## 2018-03-19 DIAGNOSIS — M25662 Stiffness of left knee, not elsewhere classified: Secondary | ICD-10-CM | POA: Diagnosis not present

## 2018-03-19 NOTE — Therapy (Signed)
[facility] [address]  [address] [place], [place], [address] Phone: [phone]   Fax:  [phone]  Physical Therapy Treatment  Patient Details  Name: Tanya Kelly MRN: [id number] Date of Birth: 10-Nov-1965 Referring Provider (PT): Norton Blizzard, MD   Encounter Date: 03/19/2018  PT End of Session - 03/19/18 1515    Visit Number  6    Number of Visits  13    Date for PT Re-Evaluation  04/10/18    Authorization Type  BCBS    PT Start Time  1457   patient late   PT Stop Time  1526    PT Time Calculation (min)  29 min    Activity Tolerance  Patient tolerated treatment well    Behavior During Therapy  [facility] for tasks assessed/performed       History reviewed. No pertinent past medical history.  Past Surgical History:  Procedure Laterality Date  . CESAREAN SECTION    . VARICOSE VEIN SURGERY      There were no vitals filed for this visit.  Subjective Assessment - 03/19/18 1457    Subjective  Patient arrived late. Reports today was her first day back to work. Reports she was miserable- legs and feet were hurting. Wears steel-toed shoes and they were contributing to this pain.     Pertinent History  HTN, arthritis, back pain    Diagnostic tests  02/09/18 L knee MRI: Negative for meniscal or ligament tear.  No internal derangement. Mild osteoarthritis about the knee.    Patient Stated Goals  get better at walking and exercising     Currently in Pain?  Yes    Pain Score  7     Pain Location  Knee    Pain Orientation  Right;Lateral    Pain Descriptors / Indicators  Dull    Pain Type  Acute pain                       [facility] Adult PT Treatment/Exercise - 03/19/18 0001      Exercises   Exercises  Knee/Hip      Knee/Hip Exercises: Stretches   Other Knee/Hip Stretches  KTOS x30" each LE       Knee/Hip Exercises: Aerobic   Recumbent Bike  L3 x 6 min      Knee/Hip Exercises: Standing   Wall Squat  1  set;3 seconds;Limitations    Wall Squat Limitations  cues to avoid deep squat to avoid pain   c/o mild burning in B knees after completing exercise   Other Standing Knee Exercises  monster walks forward with red TB around ankles 2x70ft      Knee/Hip Exercises: Supine   Bridges  Strengthening;Both;1 set;Limitations;15 reps    Bridges Limitations  B LEs straight on orange pball   cues for rhythmic breathing   Other Supine Knee/Hip Exercises  bridge with HS bias x10      Knee/Hip Exercises: Sidelying   Hip ADduction  Strengthening;Right;Left;1 set;10 reps;Limitations    Hip ADduction Limitations  opposite LE elevated on bolster    Clams  10x each with red TB and pulse      Manual Therapy   Manual Therapy  Taping    Kinesiotex  Create Space      Kinesiotix   Create Space  B chondromalacia patellae pattern- 50% stretch on M & L sides of patella and across patellar tendon  PT Education - 03/19/18 1617    Education Details  Patient educated on wear time, precautions, and removal    Person(s) Educated  Patient    Methods  Explanation    Comprehension  Verbalized understanding       PT Short Term Goals - 03/11/18 1705      PT SHORT TERM GOAL #1   Title  Patient to be independent with initial HEP.    Time  3    Period  Weeks    Status  Achieved        PT Long Term Goals - 03/08/18 1012      PT LONG TERM GOAL #1   Title  Patient to be independent with advanced HEP.    Time  6    Period  Weeks    Status  On-going      PT LONG TERM GOAL #2   Title  Patient to demonstrate [facility] and pain free L knee ROM.    Time  6    Period  Weeks    Status  On-going      PT LONG TERM GOAL #3   Title  Patient to demonstrate >=4+/5 strength in B LEs.    Time  6    Period  Weeks    Status  On-going      PT LONG TERM GOAL #4   Title  Patient to report tolerance of 1 hour of walking without pain limiting.    Time  6    Period  Weeks    Status  On-going      PT LONG  TERM GOAL #5   Title  Patient to report 50% improvement in pain level when standing up from prolonged sitting.     Time  6    Period  Weeks    Status  On-going            Plan - 03/19/18 1615    Clinical Impression Statement  Patient arrived late to session. Reported that today was her first day back at work and she was "miserable" d/t pain in knees and feet. Tolerated progressive LE strengthening ther-ex today. Patient able to perform clamshells with banded resistance and added pulse for challenge. Introduced sidelying hip adduction with opposite LE elevated on bolster as patient was having difficulty with initial positioning. C/o muscle fatigue with monster walks which was eased with KTOS stretch. Addressed pain with taping to B knees. Patient educated on wear time, precautions, and removal. No complaints of increased pain at end of session.     Clinical Impairments Affecting Rehab Potential  HTN, arthritis, back pain    PT Treatment/Interventions  ADLs/Self Care Home Management;Cryotherapy;Electrical Stimulation;Iontophoresis 4mg /ml Dexamethasone;Moist Heat;Ultrasound;Gait training;Stair training;Functional mobility training;Therapeutic activities;Therapeutic exercise;Manual techniques;Orthotic Fit/Training;Patient/family education;Neuromuscular re-education;Balance training;Passive range of motion;Dry needling;Energy conservation;Splinting;Taping;Vasopneumatic Device    PT Next Visit Plan  assess benefit from taping    Consulted and Agree with Plan of Care  Patient       Patient will benefit from skilled therapeutic intervention in order to improve the following deficits and impairments:  Decreased endurance, Increased edema, Decreased activity tolerance, Decreased strength, Pain, Difficulty walking, Decreased range of motion, Postural dysfunction, Impaired flexibility  Visit Diagnosis: Acute pain of left knee  Stiffness of left knee, not elsewhere classified  Muscle weakness  (generalized)  Difficulty in walking, not elsewhere classified     Problem List Patient Active Problem List   Diagnosis Date Noted  . Right knee pain 06/15/2017  .  Onychomycosis due to dermatophyte 04/09/2017  . Tinea pedis of both feet 04/09/2017  . 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 Fuchs' corneal dystrophy 12/27/2015  . Leg pain 12/27/2015  . Macular retinal cyst of left eye 12/27/2015  . Migraine headache 12/27/2015  . Nuclear sclerotic cataract of both eyes 12/27/2015    Anette GuarneriYevgeniya Kovalenko, PT, DPT 03/19/18 4:19 PM   [facility] [place] [address]  [address] [place], [place], [address] Phone: [phone]   Fax:  [phone]  Name: Hilary Hertzudrey Sapp MRN: [id number] Date of Birth: [birth year]

## 2018-03-21 ENCOUNTER — Ambulatory Visit: Payer: BLUE CROSS/BLUE SHIELD

## 2018-03-21 DIAGNOSIS — R262 Difficulty in walking, not elsewhere classified: Secondary | ICD-10-CM | POA: Diagnosis not present

## 2018-03-21 DIAGNOSIS — M25662 Stiffness of left knee, not elsewhere classified: Secondary | ICD-10-CM | POA: Diagnosis not present

## 2018-03-21 DIAGNOSIS — M25562 Pain in left knee: Secondary | ICD-10-CM

## 2018-03-21 DIAGNOSIS — M6281 Muscle weakness (generalized): Secondary | ICD-10-CM

## 2018-03-21 NOTE — Therapy (Signed)
[facility] Outpatient Rehabilitation [facility] [address]  [address] [place], [place], [address] Phone: [phone]   Fax:  [phone]  Physical Therapy Treatment  Patient Details  Name: Tanya Kelly MRN: [id number] Date of Birth: 15-Apr-1966 Referring Provider (PT): Norton Blizzard, MD   Encounter Date: 03/21/2018  PT End of Session - 03/21/18 1540    Visit Number  7    Number of Visits  13    Date for PT Re-Evaluation  04/10/18    Authorization Type  BCBS    PT Start Time  1535    PT Stop Time  1613    PT Time Calculation (min)  38 min    Activity Tolerance  Patient tolerated treatment well    Behavior During Therapy  [facility] for tasks assessed/performed       No past medical history on file.  Past Surgical History:  Procedure Laterality Date  . CESAREAN SECTION    . VARICOSE VEIN SURGERY      There were no vitals filed for this visit.  Subjective Assessment - 03/21/18 1538    Subjective  Pt. reporting some benefit from taping applied last visit.      Pertinent History  HTN, arthritis, back pain    Diagnostic tests  02/09/18 L knee MRI: Negative for meniscal or ligament tear.  No internal derangement. Mild osteoarthritis about the knee.    Patient Stated Goals  get better at walking and exercising     Currently in Pain?  No/denies    Pain Score  0-No pain   Pt. noting 6/10 dull pain at worst    Pain Location  Knee    Pain Orientation  Left;Medial    Pain Descriptors / Indicators  Dull    Pain Type  Acute pain    Multiple Pain Sites  No                       [facility] Adult PT Treatment/Exercise - 03/21/18 1545      Knee/Hip Exercises: Aerobic   Recumbent Bike  L3 x 6 min      Knee/Hip Exercises: Standing   Hip Flexion  Right;Left;10 reps;Knee straight    Hip Flexion Limitations  yellow TB at ankle; 2 ski poles     Hip ADduction  Right;Left;10 reps    Hip ADduction Limitations  yellow TB at ankle; 2 ski poles     Hip  Abduction  Right;Left;10 reps;Knee straight    Abduction Limitations  yellow TB at ankle; 2 ski poles     Hip Extension  Right;Left;10 reps;Knee straight;Stengthening    Extension Limitations  yellow TB at ankle; 2 ski poles     Wall Squat  15 reps;5 seconds    Wall Squat Limitations  Cues required for LE spacing       Knee/Hip Exercises: Seated   Other Seated Knee/Hip Exercises  B seated fitter leg press (1 blue, 1 black band) x 15 reps     Sit to [employer]  10 reps;without UE support      Manual Therapy   Manual Therapy  Taping    Kinesiotex  Create Space      Kinesiotix   Create Space  B chondramalacia patellar pattern- 50% stretch on M & L sides of patella and across patellar tendon               PT Short Term Goals - 03/11/18 1705  PT SHORT TERM GOAL #1   Title  Patient to be independent with initial HEP.    Time  3    Period  Weeks    Status  Achieved        PT Long Term Goals - 03/08/18 1012      PT LONG TERM GOAL #1   Title  Patient to be independent with advanced HEP.    Time  6    Period  Weeks    Status  On-going      PT LONG TERM GOAL #2   Title  Patient to demonstrate [facility] and pain free L knee ROM.    Time  6    Period  Weeks    Status  On-going      PT LONG TERM GOAL #3   Title  Patient to demonstrate >=4+/5 strength in B LEs.    Time  6    Period  Weeks    Status  On-going      PT LONG TERM GOAL #4   Title  Patient to report tolerance of 1 hour of walking without pain limiting.    Time  6    Period  Weeks    Status  On-going      PT LONG TERM GOAL #5   Title  Patient to report 50% improvement in pain level when standing up from prolonged sitting.     Time  6    Period  Weeks    Status  On-going            Plan - 03/21/18 1541    Clinical Impression Statement  Pt. noting some benefit from taping applied last visit thus reapplied today to B knees in same pattern.  Tolerated advancement of proximal hip strengthening well today  without complaint of knee pain.  Ended visit pain free.  Will continue to progress toward goals.      Clinical Impairments Affecting Rehab Potential  HTN, arthritis, back pain    PT Treatment/Interventions  ADLs/Self Care Home Management;Cryotherapy;Electrical Stimulation;Iontophoresis 4mg /ml Dexamethasone;Moist Heat;Ultrasound;Gait training;Stair training;Functional mobility training;Therapeutic activities;Therapeutic exercise;Manual techniques;Orthotic Fit/Training;Patient/family education;Neuromuscular re-education;Balance training;Passive range of motion;Dry needling;Energy conservation;Splinting;Taping;Vasopneumatic Device    Consulted and Agree with Plan of Care  Patient       Patient will benefit from skilled therapeutic intervention in order to improve the following deficits and impairments:  Decreased endurance, Increased edema, Decreased activity tolerance, Decreased strength, Pain, Difficulty walking, Decreased range of motion, Postural dysfunction, Impaired flexibility  Visit Diagnosis: Acute pain of left knee  Stiffness of left knee, not elsewhere classified  Muscle weakness (generalized)  Difficulty in walking, not elsewhere classified     Problem List Patient Active Problem List   Diagnosis Date Noted  . Right knee pain 06/15/2017  . Onychomycosis due to dermatophyte 04/09/2017  . Tinea pedis of both feet 04/09/2017  . 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 Fuchs' corneal dystrophy 12/27/2015  . Leg pain 12/27/2015  . Macular retinal cyst of left eye 12/27/2015  . Migraine headache 12/27/2015  . Nuclear sclerotic cataract of both eyes 12/27/2015    Kermit BaloMicah Marieann Zipp, PTA 03/21/18 4:23 PM    [facility] [place] [address]  [address] [place], [place], [address] Phone: [phone]   Fax:  [phone]  Name: Tanya Kelly MRN: [id number] Date of Birth: June 16, 1965

## 2018-03-25 ENCOUNTER — Ambulatory Visit: Payer: BLUE CROSS/BLUE SHIELD

## 2018-03-25 DIAGNOSIS — M6281 Muscle weakness (generalized): Secondary | ICD-10-CM

## 2018-03-25 DIAGNOSIS — R262 Difficulty in walking, not elsewhere classified: Secondary | ICD-10-CM

## 2018-03-25 DIAGNOSIS — M25562 Pain in left knee: Secondary | ICD-10-CM

## 2018-03-25 DIAGNOSIS — M25662 Stiffness of left knee, not elsewhere classified: Secondary | ICD-10-CM | POA: Diagnosis not present

## 2018-03-25 NOTE — Therapy (Signed)
[facility] [address]  [place], [place], [address] Phone: [phone]   Fax:  [phone]  Physical Therapy Treatment  Patient Details  Name: Tanya Kelly MRN: [id number] Date of Birth: May 05, 1966 Referring Provider (PT): Karlton Lemon, MD   Encounter Date: 03/25/2018  PT End of Session - 03/25/18 1546    Visit Number  8    Number of Visits  13    Date for PT Re-Evaluation  04/10/18    Authorization Type  BCBS    PT Start Time  1540    PT Stop Time  1615    PT Time Calculation (min)  35 min    Activity Tolerance  Patient tolerated treatment well    Behavior During Therapy  [facility] for tasks assessed/performed       No past medical history on file.  Past Surgical History:  Procedure Laterality Date  . CESAREAN SECTION    . VARICOSE VEIN SURGERY      There were no vitals filed for this visit.  Subjective Assessment - 03/25/18 1540    Subjective  Pt. noting benefit from taping since last visit and reports taping still intact today.      Pertinent History  HTN, arthritis, back pain    How long can you sit comfortably?  unlimited    How long can you stand comfortably?  stands 8 hours at work    How long can you walk comfortably?  1 hour    Diagnostic tests  02/09/18 L knee MRI: Negative for meniscal or ligament tear.  No internal derangement. Mild osteoarthritis about the knee.    Patient Stated Goals  get better at walking and exercising     Currently in Pain?  No/denies    Pain Score  0-No pain   didn't have much pain over weekend   Pain Location  Knee    Multiple Pain Sites  No                       [facility] Adult PT Treatment/Exercise - 03/25/18 1550      Knee/Hip Exercises: Aerobic   Nustep  Lvl 5, 7 min      Knee/Hip Exercises: Machines for Strengthening   Cybex Knee Flexion  B LE's: 25# x 10 reps     Cybex Leg Press  B LE: 25# x 15 reps       Knee/Hip Exercises: Standing   Heel Raises  Both;20 reps    Heel Raises Limitations  counter     Functional Squat  15 reps;3 seconds    Functional Squat Limitations  TRX     Other Standing Knee Exercises  Fitter hip extension L (1 blue, 1 black) x 10 reps       Knee/Hip Exercises: Supine   Bridges with Diona Foley Squeeze  Both;15 reps               PT Short Term Goals - 03/11/18 1705      PT SHORT TERM GOAL #1   Title  Patient to be independent with initial HEP.    Time  3    Period  Weeks    Status  Achieved        PT Long Term Goals - 03/25/18 1601      PT LONG TERM GOAL #1   Title  Patient to be independent with advanced HEP.    Time  6  Period  Weeks    Status  Partially Met      PT LONG TERM GOAL #2   Title  Patient to demonstrate [facility] and pain free L knee ROM.    Time  6    Period  Weeks    Status  On-going      PT LONG TERM GOAL #3   Title  Patient to demonstrate >=4+/5 strength in B LEs.    Time  6    Period  Weeks    Status  On-going      PT LONG TERM GOAL #4   Title  Patient to report tolerance of 1 hour of walking without pain limiting.    Time  6    Period  Weeks    Status  Achieved      PT LONG TERM GOAL #5   Title  Patient to report 50% improvement in pain level when standing up from prolonged sitting.     Time  6    Period  Weeks    Status  On-going            Plan - 03/25/18 1547    Clinical Impression Statement  Pt. arrived late to therapy thus session time limited.  Pt. noting she returned to work last week without significant pain.  Tolerated progression of LE strengthening activities well today without complaint of pain.  Ended visit pain free.  May consider further taping in coming visits as pt. still noting significant improvement in L knee comfort with this.      Clinical Impairments Affecting Rehab Potential  HTN, arthritis, back pain    PT Treatment/Interventions  ADLs/Self Care Home Management;Cryotherapy;Electrical Stimulation;Iontophoresis 7m/ml  Dexamethasone;Moist Heat;Ultrasound;Gait training;Stair training;Functional mobility training;Therapeutic activities;Therapeutic exercise;Manual techniques;Orthotic Fit/Training;Patient/family education;Neuromuscular re-education;Balance training;Passive range of motion;Dry needling;Energy conservation;Splinting;Taping;Vasopneumatic Device    Consulted and Agree with Plan of Care  Patient       Patient will benefit from skilled therapeutic intervention in order to improve the following deficits and impairments:  Decreased endurance, Increased edema, Decreased activity tolerance, Decreased strength, Pain, Difficulty walking, Decreased range of motion, Postural dysfunction, Impaired flexibility  Visit Diagnosis: Acute pain of left knee  Stiffness of left knee, not elsewhere classified  Muscle weakness (generalized)  Difficulty in walking, not elsewhere classified     Problem List Patient Active Problem List   Diagnosis Date Noted  . Right knee pain 06/15/2017  . Onychomycosis due to dermatophyte 04/09/2017  . Tinea pedis of both feet 04/09/2017  . 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 Fuchs' corneal dystrophy 12/27/2015  . Leg pain 12/27/2015  . Macular retinal cyst of left eye 12/27/2015  . Migraine headache 12/27/2015  . Nuclear sclerotic cataract of both eyes 12/27/2015    MBess Harvest PTA 03/25/18 6:30 PM   [facility] [place] [address] [place] [address]Phone: [phone]  Fax:  [phone] Name: AMalee GraysMRN: [id number] of Birth: 725-Aug-1967

## 2018-03-28 ENCOUNTER — Encounter: Payer: Self-pay | Admitting: Physical Therapy

## 2018-03-28 ENCOUNTER — Ambulatory Visit: Payer: BLUE CROSS/BLUE SHIELD | Admitting: Physical Therapy

## 2018-03-28 DIAGNOSIS — M25562 Pain in left knee: Secondary | ICD-10-CM | POA: Diagnosis not present

## 2018-03-28 DIAGNOSIS — M6281 Muscle weakness (generalized): Secondary | ICD-10-CM | POA: Diagnosis not present

## 2018-03-28 DIAGNOSIS — R262 Difficulty in walking, not elsewhere classified: Secondary | ICD-10-CM | POA: Diagnosis not present

## 2018-03-28 DIAGNOSIS — M25662 Stiffness of left knee, not elsewhere classified: Secondary | ICD-10-CM

## 2018-03-28 NOTE — Therapy (Signed)
[facility] [address]  [place], [place], [address] Phone: [phone]   Fax:  [phone]  Physical Therapy Treatment  Patient Details  Name: Tanya Kelly MRN: [id number] Date of Birth: 1965-08-18 Referring Provider (PT): Karlton Lemon, MD   Encounter Date: 03/28/2018  PT End of Session - 03/28/18 1611    Visit Number  9    Number of Visits  13    Date for PT Re-Evaluation  04/10/18    Authorization Type  BC[employer]    PT Start Time  1540   patient late   PT Stop Time  1608    PT Time Calculation (min)  28 min    Activity Tolerance  Patient tolerated treatment well;No increased pain    Behavior During Therapy  [facility] for tasks assessed/performed       History reviewed. No pertinent past medical history.  Past Surgical History:  Procedure Laterality Date  . CESAREAN SECTION    . VARICOSE VEIN SURGERY      There were no vitals filed for this visit.  Subjective Assessment - 03/28/18 1539    Subjective  Reports she is late today because her son's girlfriend is in labor. Reports kness have been okay but has been a bit sore d/t possible going up and down stairs. Says she has been walking more during her break at work for exercise. Reports she believes she may need PT for her shoulder as it is hurting her from work .    Pertinent History  HTN, arthritis, back pain    Diagnostic tests  02/09/18 L knee MRI: Negative for meniscal or ligament tear.  No internal derangement. Mild osteoarthritis about the knee.    Patient Stated Goals  get better at walking and exercising     Currently in Pain?  Yes    Pain Score  7     Pain Location  Knee    Pain Orientation  Right    Pain Descriptors / Indicators  Dull    Pain Type  Acute pain                       [facility] Adult PT Treatment/Exercise - 03/28/18 0001      Exercises   Exercises  Knee/Hip      Knee/Hip Exercises: Aerobic   Recumbent Bike  L3 x 5 min       Knee/Hip Exercises: Machines for Strengthening   Cybex Leg Press  B LE: 25#  2x15 reps    cues to avoid hyperextension     Knee/Hip Exercises: Standing   Knee Flexion  Strengthening;Right;Left;2 sets;15 reps;Limitations    Knee Flexion Limitations  standing at counter top HS curl with 2# 15x each LE    Hip Abduction  Stengthening;Right;Left;1 set;10 reps;Knee straight;Limitations   good posture and form   Abduction Limitations  at counter top; 2# each LE    Functional Squat  15 reps;3 seconds    Functional Squat Limitations  TRX squat + heel raise    Other Standing Knee Exercises  monster walks forward/backward with red TB around ankles 2x75f      [employer]   Create Space  B chondramalacia patellae pattern- 50% stretch on M & L sides of patella and across patellar tendon               PT Short Term Goals - 03/11/18 1705      PT SHORT TERM GOAL #  1   Title  Patient to be independent with initial HEP.    Time  3    Period  Weeks    Status  Achieved        PT Long Term Goals - 03/25/18 1601      PT LONG TERM GOAL #1   Title  Patient to be independent with advanced HEP.    Time  6    Period  Weeks    Status  Partially Met      PT LONG TERM GOAL #2   Title  Patient to demonstrate [facility] and pain free L knee ROM.    Time  6    Period  Weeks    Status  On-going      PT LONG TERM GOAL #3   Title  Patient to demonstrate >=4+/5 strength in B LEs.    Time  6    Period  Weeks    Status  On-going      PT LONG TERM GOAL #4   Title  Patient to report tolerance of 1 hour of walking without pain limiting.    Time  6    Period  Weeks    Status  Achieved      PT LONG TERM GOAL #5   Title  Patient to report 50% improvement in pain level when standing up from prolonged sitting.     Time  6    Period  Weeks    Status  On-going            Plan - 03/28/18 1611    Clinical Impression Statement  Patient reports she is late today because her son's girlfriend is  in labor with her grandson. Worked on progressive LE strengthening today with focus on quad and glute strength. Patient reporting baseline pain in R knee today but good tolerance and no increased pain with any activities. Good form with standing hip abduction with weighted resistance. Able to progress squats using TRX and with additional heel raise without issues. Ended session with KT taping to B knees as patient reporting that she gets benefit from this modality. Patient aware of precautions and wear time. No complaints at end of session.     Clinical Impairments Affecting Rehab Potential  HTN, arthritis, back pain    PT Treatment/Interventions  ADLs/Self Care Home Management;Cryotherapy;Electrical Stimulation;Iontophoresis '4mg'$ /ml Dexamethasone;Moist Heat;Ultrasound;Gait training;Stair training;Functional mobility training;Therapeutic activities;Therapeutic exercise;Manual techniques;Orthotic Fit/Training;Patient/family education;Neuromuscular re-education;Balance training;Passive range of motion;Dry needling;Energy conservation;Splinting;Taping;Vasopneumatic Device    PT Next Visit Plan  progress LE strengthening at tolerated     Consulted and Agree with Plan of Care  Patient       Patient will benefit from skilled therapeutic intervention in order to improve the following deficits and impairments:  Decreased endurance, Increased edema, Decreased activity tolerance, Decreased strength, Pain, Difficulty walking, Decreased range of motion, Postural dysfunction, Impaired flexibility  Visit Diagnosis: Acute pain of left knee  Stiffness of left knee, not elsewhere classified  Muscle weakness (generalized)  Difficulty in walking, not elsewhere classified     Problem List Patient Active Problem List   Diagnosis Date Noted  . Right knee pain 06/15/2017  . Onychomycosis due to dermatophyte 04/09/2017  . Tinea pedis of both feet 04/09/2017  . 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  Fuchs' corneal dystrophy 12/27/2015  . Leg pain 12/27/2015  . Macular retinal cyst of left eye 12/27/2015  . Migraine headache 12/27/2015  . Nuclear sclerotic cataract of both eyes 12/27/2015  Janene Harvey, PT, DPT 03/28/18 4:16 PM    [facility] [address]  [place], [place], [address] Phone: [phone]   Fax:  [phone]  Name: Tanya Kelly MRN: [id number] Date of Birth: 1965/07/07

## 2018-04-01 ENCOUNTER — Ambulatory Visit: Payer: BLUE CROSS/BLUE SHIELD

## 2018-04-01 DIAGNOSIS — M25562 Pain in left knee: Secondary | ICD-10-CM

## 2018-04-01 DIAGNOSIS — R262 Difficulty in walking, not elsewhere classified: Secondary | ICD-10-CM | POA: Diagnosis not present

## 2018-04-01 DIAGNOSIS — M25662 Stiffness of left knee, not elsewhere classified: Secondary | ICD-10-CM | POA: Diagnosis not present

## 2018-04-01 DIAGNOSIS — M6281 Muscle weakness (generalized): Secondary | ICD-10-CM | POA: Diagnosis not present

## 2018-04-01 NOTE — Therapy (Signed)
[facility] [address]  [place], [place], [address] Phone: [phone]   Fax:  [phone]  Physical Therapy Treatment  Patient Details  Name: Tanya Kelly MRN: [id number] Date of Birth: July 15, 1965 Referring Provider (PT): Karlton Lemon, MD   Encounter Date: 04/01/2018  PT End of Session - 04/01/18 1542    Visit Number  10    Number of Visits  13    Date for PT Re-Evaluation  04/10/18    Authorization Type  BCBS    PT Start Time  1916    PT Stop Time  1613    PT Time Calculation (min)  38 min    Activity Tolerance  Patient tolerated treatment well;No increased pain    Behavior During Therapy  [facility] for tasks assessed/performed       No past medical history on file.  Past Surgical History:  Procedure Laterality Date  . CESAREAN SECTION    . VARICOSE VEIN SURGERY      There were no vitals filed for this visit.  Subjective Assessment - 04/01/18 1540    Subjective  Pt. reporting her R shoulder is giving her pain now and attributes this to work activities overhead.      Pertinent History  HTN, arthritis, back pain    Diagnostic tests  02/09/18 L knee MRI: Negative for meniscal or ligament tear.  No internal derangement. Mild osteoarthritis about the knee.    Patient Stated Goals  get better at walking and exercising     Currently in Pain?  Yes    Pain Score  6     Pain Location  Knee    Pain Orientation  Right    Pain Descriptors / Indicators  Dull    Pain Type  Acute pain    Pain Onset  More than a month ago    Pain Frequency  Constant    Aggravating Factors   Prolonged standing     Pain Relieving Factors  rest     Multiple Pain Sites  No         [facility] PT Assessment - 04/01/18 1550      AROM   AROM Assessment Site  Knee    Right/Left Knee  Right;Left    Right Knee Extension  0    Right Knee Flexion  117    Left Knee Extension  0    Left Knee Flexion  123      Strength   Strength  Assessment Site  Hip;Knee;Ankle    Right/Left Hip  Right;Left    Right Hip Flexion  4+/5    Right Hip ABduction  4+/5    Right Hip ADduction  4/5    Left Hip Flexion  4+/5    Left Hip ABduction  4+/5    Left Hip ADduction  4+/5    Right/Left Knee  Right;Left    Right Knee Flexion  4+/5    Right Knee Extension  4+/5    Left Knee Flexion  4+/5    Left Knee Extension  4+/5    Right/Left Ankle  Right;Left    Right Ankle Dorsiflexion  4+/5    Right Ankle Plantar Flexion  4+/5    Left Ankle Dorsiflexion  4+/5    Left Ankle Plantar Flexion  4+/5                   [facility] Adult PT Treatment/Exercise - 04/01/18 1544  Knee/Hip Exercises: Stretches   Passive Hamstring Stretch  Left;30 seconds;1 rep    Passive Hamstring Stretch Limitations  strap     Quad Stretch  Left;1 rep;60 seconds    Quad Stretch Limitations  strap       Knee/Hip Exercises: Aerobic   Recumbent Bike  L3 x 7 min      Knee/Hip Exercises: Seated   Sit to Sand  10 reps;without UE support   with ball squeeze between knees      Knee/Hip Exercises: Supine   Bridges with [employer]  Both;15 reps    Straight Leg Raises  Left;15 reps             PT Education - 04/01/18 1644    Education Details  HEP update     Person(s) Educated  Patient    Methods  Explanation;Demonstration;Verbal cues;Handout    Comprehension  Verbalized understanding;Returned demonstration;Verbal cues required;Need further instruction       PT Short Term Goals - 03/11/18 1705      PT SHORT TERM GOAL #1   Title  Patient to be independent with initial HEP.    Time  3    Period  Weeks    Status  Achieved        PT Long Term Goals - 04/01/18 1543      PT LONG TERM GOAL #1   Title  Patient to be independent with advanced HEP.    Time  6    Period  Weeks    Status  Partially Met      PT LONG TERM GOAL #2   Title  Patient to demonstrate [facility] and pain free L knee ROM.    Time  6    Period  Weeks    Status   Partially Met      PT LONG TERM GOAL #3   Title  Patient to demonstrate >=4+/5 strength in B LEs.    Time  6    Period  Weeks    Status  Partially Met      PT LONG TERM GOAL #4   Title  Patient to report tolerance of 1 hour of walking without pain limiting.    Time  6    Period  Weeks    Status  Achieved      PT LONG TERM GOAL #5   Title  Patient to report 50% improvement in pain level when standing up from prolonged sitting.     Time  6    Period  Weeks    Status  Achieved            Plan - 04/01/18 1543    Clinical Impression Statement  Pt. reporting she "strained" her R shoulder with work related tasks however spoke with her company doctor regarding this.  Pt. noting L knee improved ~ 60% overall since starting therapy.  Pt. tolerated all strengthening and LE stretching activities today well.  Pt. able to demo improved L knee AROM to 0-123 dg and able to demo improved LE strength today with MMT now grossly 4+/5 with exception of R hip adductor group 4/5 strength.  HEP update to focus on remaining strength deficits.  Will continue to progress toward goals.      Clinical Impairments Affecting Rehab Potential  HTN, arthritis, back pain    PT Treatment/Interventions  ADLs/Self Care Home Management;Cryotherapy;Electrical Stimulation;Iontophoresis '4mg'$ /ml Dexamethasone;Moist Heat;Ultrasound;Gait training;Stair training;Functional mobility training;Therapeutic activities;Therapeutic exercise;Manual techniques;Orthotic Fit/Training;Patient/family education;Neuromuscular re-education;Balance training;Passive range of motion;Dry needling;Energy  conservation;Splinting;Taping;Vasopneumatic Device    PT Next Visit Plan  progress LE strengthening at tolerated     Consulted and Agree with Plan of Care  Patient       Patient will benefit from skilled therapeutic intervention in order to improve the following deficits and impairments:  Decreased endurance, Increased edema, Decreased activity  tolerance, Decreased strength, Pain, Difficulty walking, Decreased range of motion, Postural dysfunction, Impaired flexibility  Visit Diagnosis: Acute pain of left knee  Stiffness of left knee, not elsewhere classified  Muscle weakness (generalized)  Difficulty in walking, not elsewhere classified     Problem List Patient Active Problem List   Diagnosis Date Noted  . Right knee pain 06/15/2017  . Onychomycosis due to dermatophyte 04/09/2017  . Tinea pedis of both feet 04/09/2017  . 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 Fuchs' corneal dystrophy 12/27/2015  . Leg pain 12/27/2015  . Macular retinal cyst of left eye 12/27/2015  . Migraine headache 12/27/2015  . Nuclear sclerotic cataract of both eyes 12/27/2015    Bess Harvest, PTA 04/01/18 6:15 PM    [facility] [address]  [place], [place], [address] Phone: [phone]   Fax:  [phone]  Name: Tanya Kelly MRN: [id number] Date of Birth: [birth year]

## 2018-04-03 ENCOUNTER — Encounter: Payer: Self-pay | Admitting: Physical Therapy

## 2018-04-03 ENCOUNTER — Ambulatory Visit: Payer: BLUE CROSS/BLUE SHIELD | Admitting: Physical Therapy

## 2018-04-03 DIAGNOSIS — M6281 Muscle weakness (generalized): Secondary | ICD-10-CM | POA: Diagnosis not present

## 2018-04-03 DIAGNOSIS — M25662 Stiffness of left knee, not elsewhere classified: Secondary | ICD-10-CM | POA: Diagnosis not present

## 2018-04-03 DIAGNOSIS — R262 Difficulty in walking, not elsewhere classified: Secondary | ICD-10-CM

## 2018-04-03 DIAGNOSIS — M25562 Pain in left knee: Secondary | ICD-10-CM

## 2018-04-03 NOTE — Therapy (Signed)
[facility] [address]  [place], [place], [address] Phone: [phone]   Fax:  [phone]  Physical Therapy Treatment  Patient Details  Name: Tanya Kelly MRN: [id number] Date of Birth: 1965-08-06 Referring Provider (PT): Karlton Lemon, MD   Encounter Date: 04/03/2018  PT End of Session - 04/03/18 1612    Visit Number  11    Number of Visits  13    Date for PT Re-Evaluation  04/10/18    Authorization Type  BCBS    PT Start Time  1533    PT Stop Time  1611    PT Time Calculation (min)  38 min    Activity Tolerance  Patient tolerated treatment well    Behavior During Therapy  [facility] for tasks assessed/performed       History reviewed. No pertinent past medical history.  Past Surgical History:  Procedure Laterality Date  . CESAREAN SECTION    . VARICOSE VEIN SURGERY      There were no vitals filed for this visit.  Subjective Assessment - 04/03/18 1534    Subjective  Reports knees havent been bothering her lately. Did wake up with pain along B shins- thinks it may be due to being on her feet the day before.     Pertinent History  HTN, arthritis, back pain    Diagnostic tests  02/09/18 L knee MRI: Negative for meniscal or ligament tear.  No internal derangement. Mild osteoarthritis about the knee.    Patient Stated Goals  get better at walking and exercising     Currently in Pain?  No/denies                       [facility] Adult PT Treatment/Exercise - 04/03/18 0001      Exercises   Exercises  Knee/Hip      Knee/Hip Exercises: Stretches   Passive Hamstring Stretch  Left;30 seconds;2 reps    Passive Hamstring Stretch Limitations  supine strap       Knee/Hip Exercises: Aerobic   Nustep  Lvl 4, 7 min      Knee/Hip Exercises: Machines for Strengthening   Cybex Knee Extension  B LEs 2x10 20#    Cybex Knee Flexion  B LE's: 25# 2x10 reps    cues for slow eccentric return   Cybex Leg Press   B LE: 30#  x15 reps       Knee/Hip Exercises: Standing   Forward Step Up  Right;Left;1 set;5 sets;Hand Hold: 1;Limitations   mild, nonlasting pain   Forward Step Up Limitations  anterior step up with 1 UE support on ~15in step    Other Standing Knee Exercises  monster walks forward/backward with red TB around ankles 2x72f   cues for larger steps     Knee/Hip Exercises: Supine   Bridges  Strengthening;Both;1 set;Limitations;15 reps    Bridges Limitations  B LEs straight on orange pball    Other Supine Knee/Hip Exercises  bridge + HS curl on orange pball   attempted 3 reps but unable to tolerate            PT Education - 04/03/18 1611    Education Details  edu on proper wear time of KT tape, spoke with patient about importance of continuing exercise regimen after D/C    Person(s) Educated  Patient    Methods  Explanation;Demonstration;Tactile cues;Verbal cues    Comprehension  Verbalized understanding;Returned demonstration  PT Short Term Goals - 03/11/18 1705      PT SHORT TERM GOAL #1   Title  Patient to be independent with initial HEP.    Time  3    Period  Weeks    Status  Achieved        PT Long Term Goals - 04/01/18 1543      PT LONG TERM GOAL #1   Title  Patient to be independent with advanced HEP.    Time  6    Period  Weeks    Status  Partially Met      PT LONG TERM GOAL #2   Title  Patient to demonstrate [facility] and pain free L knee ROM.    Time  6    Period  Weeks    Status  Partially Met      PT LONG TERM GOAL #3   Title  Patient to demonstrate >=4+/5 strength in B LEs.    Time  6    Period  Weeks    Status  Partially Met      PT LONG TERM GOAL #4   Title  Patient to report tolerance of 1 hour of walking without pain limiting.    Time  6    Period  Weeks    Status  Achieved      PT LONG TERM GOAL #5   Title  Patient to report 50% improvement in pain level when standing up from prolonged sitting.     Time  6    Period  Weeks    Status   Achieved            Plan - 04/03/18 1613    Clinical Impression Statement  Patient reports no pain in knees today- attributes this to having some help at work earlier today. 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 Worked on anterior step ups on 15 in box to simulate stepping onto bus, with patient reporting pain in knees at end range flexion, but reports that pain dissipated as soon as stopping the activity. Spoke with patient about continuing fitness regimen at gym or joining community-based fitness program. Patient reports she is open to the idea. Worked on LE machine strengthening activities, educating patient on proper machine use and form with exercises for good carryover. Took KT tape off B knees for skin inspection- skin WNL.  Advised patient not to keep tape on for longer than 3 days to avoid skin irritation. Patient reported understanding. Mild c/o pain in knees and evidence of slight edema in lateral L knee however patient declined ice.     Clinical Impairments Affecting Rehab Potential  HTN, arthritis, back pain    PT Treatment/Interventions  ADLs/Self Care Home Management;Cryotherapy;Electrical Stimulation;Iontophoresis '4mg'$ /ml Dexamethasone;Moist Heat;Ultrasound;Gait training;Stair training;Functional mobility training;Therapeutic activities;Therapeutic exercise;Manual techniques;Orthotic Fit/Training;Patient/family education;Neuromuscular re-education;Balance training;Passive range of motion;Dry needling;Energy conservation;Splinting;Taping;Vasopneumatic Device    PT Next Visit Plan  progress LE strengthening at tolerated     Consulted and Agree with Plan of Care  Patient       Patient will benefit from skilled therapeutic intervention in order to improve the following deficits and impairments:  Decreased endurance, Increased edema, Decreased activity tolerance, Decreased strength, Pain, Difficulty walking,  Decreased range of motion, Postural dysfunction, Impaired flexibility  Visit Diagnosis: Acute pain of left knee  Stiffness of left knee, not elsewhere classified  Muscle weakness (generalized)  Difficulty in  walking, not elsewhere classified     Problem List Patient Active Problem List   Diagnosis Date Noted  . Right knee pain 06/15/2017  . Onychomycosis due to dermatophyte 04/09/2017  . Tinea pedis of both feet 04/09/2017  . 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 Fuchs' corneal dystrophy 12/27/2015  . Leg pain 12/27/2015  . Macular retinal cyst of left eye 12/27/2015  . Migraine headache 12/27/2015  . Nuclear sclerotic cataract of both eyes 12/27/2015    Janene Harvey, PT, DPT 04/03/18 4:20 PM   [facility] [address]  [place], [place], [address] Phone: [phone]   Fax:  [phone]  Name: Tanya Kelly MRN: [id number] Date of Birth: [birth year]

## 2018-04-08 ENCOUNTER — Ambulatory Visit: Payer: BLUE CROSS/BLUE SHIELD | Attending: Family Medicine

## 2018-04-08 DIAGNOSIS — R262 Difficulty in walking, not elsewhere classified: Secondary | ICD-10-CM | POA: Diagnosis not present

## 2018-04-08 DIAGNOSIS — M6281 Muscle weakness (generalized): Secondary | ICD-10-CM | POA: Insufficient documentation

## 2018-04-08 DIAGNOSIS — M25662 Stiffness of left knee, not elsewhere classified: Secondary | ICD-10-CM

## 2018-04-08 DIAGNOSIS — M25562 Pain in left knee: Secondary | ICD-10-CM | POA: Diagnosis not present

## 2018-04-08 NOTE — Therapy (Signed)
[facility] [address]  [employer] [place], [place], [address] Phone: [phone]   Fax:  [phone]  Physical Therapy Treatment  Patient Details  Name: Tanya Kelly MRN: [id number] Date of Birth: 30-Apr-1966 Referring Provider (PT): Karlton Lemon, MD   Encounter Date: 04/08/2018  PT End of Session - 04/08/18 1542    Visit Number  12    Number of Visits  13    Date for PT Re-Evaluation  04/10/18    Authorization Type  BC[employer]    PT Start Time  1062    PT Stop Time  6948    PT Time Calculation (min)  39 min    Activity Tolerance  Patient tolerated treatment well    Behavior During Therapy  [facility] for tasks assessed/performed       History reviewed. No pertinent past medical history.  Past Surgical History:  Procedure Laterality Date  . CESAREAN SECTION    . VARICOSE VEIN SURGERY      There were no vitals filed for this visit.  Subjective Assessment - 04/08/18 1542    Subjective  Doing well today.      Pertinent History  HTN, arthritis, back pain    Diagnostic tests  02/09/18 L knee MRI: Negative for meniscal or ligament tear.  No internal derangement. Mild osteoarthritis about the knee.    Patient Stated Goals  get better at walking and exercising     Currently in Pain?  No/denies    Pain Score  0-No pain    Multiple Pain Sites  No                       [facility] Adult PT Treatment/Exercise - 04/08/18 1546      Ambulation/Gait   Ambulation/Gait  --    Stairs  Yes    Stairs Assistance  6: Modified independent (Device/Increase time)    Stair Management Technique  One rail Right;Alternating pattern    Number of Stairs  14    Height of Stairs  7    Gait Comments  Pt. able to ascend/descend stairs x 14 with one hand rail however demonstrating limited L quad control likely due to pt. complaint of L knee pain descending       Knee/Hip Exercises: Stretches   Passive Hamstring Stretch  Left;30 seconds;2  reps    Passive Hamstring Stretch Limitations  supine strap     ITB Stretch  Left;60 seconds    ITB Stretch Limitations  strap     Piriformis Stretch  Left;30 seconds;1 rep    Piriformis Stretch Limitations  strap assist;KTOS      Knee/Hip Exercises: Aerobic   Tread Mill  L1.8 mph x 10mn      Knee/Hip Exercises: Standing   Heel Raises  Both;Left;15 reps    Heel Raises Limitations  counter     Forward Step Up  Right;Left;1 set;5 sets;Hand Hold: 1;Limitations    Forward Step Up Limitations  anterior step up with 1 UE support on ~15in step      Knee/Hip Exercises: Seated   Sit to Sand  15 reps;without UE support   with red looped TB at knees      Knee/Hip Exercises: Supine   Bridges  Both;15 reps    Bridges with [facility] Both;15 reps   3" hold    Straight Leg Raises  Left;15 reps  PT Short Term Goals - 03/11/18 1705      PT SHORT TERM GOAL #1   Title  Patient to be independent with initial HEP.    Time  3    Period  Weeks    Status  Achieved        PT Long Term Goals - 04/01/18 1543      PT LONG TERM GOAL #1   Title  Patient to be independent with advanced HEP.    Time  6    Period  Weeks    Status  Partially Met      PT LONG TERM GOAL #2   Title  Patient to demonstrate [facility] and pain free L knee ROM.    Time  6    Period  Weeks    Status  Partially Met      PT LONG TERM GOAL #3   Title  Patient to demonstrate >=4+/5 strength in B LEs.    Time  6    Period  Weeks    Status  Partially Met      PT LONG TERM GOAL #4   Title  Patient to report tolerance of 1 hour of walking without pain limiting.    Time  6    Period  Weeks    Status  Achieved      PT LONG TERM GOAL #5   Title  Patient to report 50% improvement in pain level when standing up from prolonged sitting.     Time  6    Period  Weeks    Status  Achieved            Plan - 04/08/18 1543    Clinical Impression Statement  Pt. noting she is 80% improved since last  visit.  Now able to demo normalized walking pattern without complaint of pain.  Tolerated continued step training today on ~ 15" box step to simulate work related stepping which she still complaints of L knee pain with.  Pt. tolerated all activities in session well today.  Feels she has improved with therapy.  Pt. made aware that next visit is last visit left in POC.  Will plan to address goals and further discuss pt. POC at upcoming visit.      Clinical Impairments Affecting Rehab Potential  HTN, arthritis, back pain    PT Treatment/Interventions  ADLs/Self Care Home Management;Cryotherapy;Electrical Stimulation;Iontophoresis '4mg'$ /ml Dexamethasone;Moist Heat;Ultrasound;Gait training;Stair training;Functional mobility training;Therapeutic activities;Therapeutic exercise;Manual techniques;Orthotic Fit/Training;Patient/family education;Neuromuscular re-education;Balance training;Passive range of motion;Dry needling;Energy conservation;Splinting;Taping;Vasopneumatic Device    Consulted and Agree with Plan of Care  Patient       Patient will benefit from skilled therapeutic intervention in order to improve the following deficits and impairments:  Decreased endurance, Increased edema, Decreased activity tolerance, Decreased strength, Pain, Difficulty walking, Decreased range of motion, Postural dysfunction, Impaired flexibility  Visit Diagnosis: Acute pain of left knee  Stiffness of left knee, not elsewhere classified  Muscle weakness (generalized)  Difficulty in walking, not elsewhere classified     Problem List Patient Active Problem List   Diagnosis Date Noted  . Right knee pain 06/15/2017  . Onychomycosis due to dermatophyte 04/09/2017  . Tinea pedis of both feet 04/09/2017  . 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 Fuchs' corneal dystrophy 12/27/2015  . Leg pain 12/27/2015  . Macular retinal cyst of left eye 12/27/2015  . Migraine headache 12/27/2015  . Nuclear sclerotic  cataract of both eyes 12/27/2015   Bess Harvest, PTA 04/08/18  6:21 PM   [facility] [address]  [place], [place], [address] Phone: [phone]   Fax:  [phone]  Name: Ova Gillentine MRN: [id number] Date of Birth: 07-29-1965

## 2018-04-11 ENCOUNTER — Encounter: Payer: Self-pay | Admitting: Physical Therapy

## 2018-04-11 ENCOUNTER — Ambulatory Visit: Payer: BLUE CROSS/BLUE SHIELD | Admitting: Physical Therapy

## 2018-04-11 DIAGNOSIS — M25662 Stiffness of left knee, not elsewhere classified: Secondary | ICD-10-CM

## 2018-04-11 DIAGNOSIS — R262 Difficulty in walking, not elsewhere classified: Secondary | ICD-10-CM | POA: Diagnosis not present

## 2018-04-11 DIAGNOSIS — M25562 Pain in left knee: Secondary | ICD-10-CM | POA: Diagnosis not present

## 2018-04-11 DIAGNOSIS — M6281 Muscle weakness (generalized): Secondary | ICD-10-CM

## 2018-04-11 NOTE — Therapy (Signed)
[facility] [address]  [place], [place], [address] Phone: [phone]   Fax:  [phone]  Physical Therapy Discharge Summary  Patient Details  Name: Tanya Kelly MRN: [id number] Date of Birth: February 06, 1966 Referring Provider (PT): Karlton Lemon, MD   Encounter Date: 04/11/2018  PT End of Session - 04/11/18 1607    Visit Number  13    Number of Visits  13    Date for PT Re-Evaluation  04/10/18    Authorization Type  [employer]    PT Start Time  1324   patient late   PT Stop Time  1606    PT Time Calculation (min)  30 min    Activity Tolerance  Patient tolerated treatment well    Behavior During Therapy  [facility] for tasks assessed/performed       History reviewed. No pertinent past medical history.  Past Surgical History:  Procedure Laterality Date  . CESAREAN SECTION    . VARICOSE VEIN SURGERY      There were no vitals filed for this visit.  Subjective Assessment - 04/11/18 1535    Subjective  Reports she has been doing alright. The other day her legs were aching and tight. Didn't do anything the day before except for work. Reports 80% improvement since initial eval. Notes she can get out of bed, getting out of chair, able to walk further, has returned to work.     Pertinent History  HTN, arthritis, back pain    Diagnostic tests  02/09/18 L knee MRI: Negative for meniscal or ligament tear.  No internal derangement. Mild osteoarthritis about the knee.    Patient Stated Goals  get better at walking and exercising     Currently in Pain?  No/denies         [facility] PT Assessment - 04/11/18 0001      Observation/Other Assessments   Focus on Therapeutic Outcomes (FOTO)   Knee: 99 (1% limited, 38% predicted)      AROM   AROM Assessment Site  Knee    Right/Left Knee  Left    Left Knee Extension  0    Left Knee Flexion  120      PROM   PROM Assessment Site  Knee    Left Knee Extension  -1    Left Knee Flexion   123      Strength   Strength Assessment Site  Hip;Knee;Ankle    Right/Left Hip  Right;Left    Right Hip Flexion  4+/5    Right Hip ABduction  4+/5    Right Hip ADduction  4+/5    Left Hip Flexion  4+/5    Left Hip ABduction  4+/5    Left Hip ADduction  4+/5    Right/Left Knee  Right;Left    Right Knee Flexion  4+/5    Right Knee Extension  4+/5    Left Knee Flexion  4+/5    Left Knee Extension  4+/5    Right/Left Ankle  Right;Left    Right Ankle Dorsiflexion  4+/5    Right Ankle Plantar Flexion  4+/5    Left Ankle Dorsiflexion  4+/5    Left Ankle Plantar Flexion  4+/5                   [facility] Adult PT Treatment/Exercise - 04/11/18 0001      Knee/Hip Exercises: Aerobic   Recumbent Bike  L3 x 6 min  Knee/Hip Exercises: Machines for Strengthening   Cybex Knee Extension  B LEs 15x 20#    Cybex Knee Flexion  B LE's: 25# x15    Cybex Leg Press  B LE: 35#  x15 reps              PT Education - 04/11/18 1607    Education Details  update to HEP; edu on importance of continued fitness regiment to maintain pain-free state    Person(s) Educated  Patient    Methods  Explanation;Demonstration;Tactile cues;Verbal cues;Handout    Comprehension  Verbalized understanding;Returned demonstration       PT Short Term Goals - 04/11/18 1543      PT SHORT TERM GOAL #1   Title  Patient to be independent with initial HEP.    Time  3    Period  Weeks    Status  Achieved        PT Long Term Goals - 04/11/18 1543      PT LONG TERM GOAL #1   Title  Patient to be independent with advanced HEP.    Time  6    Period  Weeks    Status  Achieved   met for current     PT LONG TERM GOAL #2   Title  Patient to demonstrate [facility] and pain free L knee ROM.    Time  6    Period  Weeks    Status  Achieved      PT LONG TERM GOAL #3   Title  Patient to demonstrate >=4+/5 strength in B LEs.    Time  6    Period  Weeks    Status  Achieved      PT LONG TERM GOAL #4   Title   Patient to report tolerance of 1 hour of walking without pain limiting.    Time  6    Period  Weeks    Status  Achieved      PT LONG TERM GOAL #5   Title  Patient to report 50% improvement in pain level when standing up from prolonged sitting.     Time  6    Period  Weeks    Status  Achieved            Plan - 04/11/18 1608    Clinical Impression Statement  Patient arrived to session with report of 80% improvement since initial eval. These improvements have been in ability to get out of bed, getting out of chair, able to walk further, has also returned to work. Patient has met all goals at this time. Demonstrated pain-free and WNL L knee ROM. Scored atleast 4+/5 strength in all LE muscle groups. Patient has met standing tolerance and STS transfer goals. Reviewed LE machine strengthening exercises for easy transition to gym program. Patient tolerated all ther-ex without pain limiting. Educated patient on importance of continued fitness regiment to maintain pain-free state. Patient agreeable. Ended session with no complaints. Patient to be D/C'd at this time d/t meeting therapy goals.     Clinical Impairments Affecting Rehab Potential  HTN, arthritis, back pain    PT Treatment/Interventions  ADLs/Self Care Home Management;Cryotherapy;Electrical Stimulation;Iontophoresis '4mg'$ /ml Dexamethasone;Moist Heat;Ultrasound;Gait training;Stair training;Functional mobility training;Therapeutic activities;Therapeutic exercise;Manual techniques;Orthotic Fit/Training;Patient/family education;Neuromuscular re-education;Balance training;Passive range of motion;Dry needling;Energy conservation;Splinting;Taping;Vasopneumatic Device    PT Next Visit Plan  D/C at this time    Consulted and Agree with Plan of Care  Patient       Patient will benefit  from skilled therapeutic intervention in order to improve the following deficits and impairments:  Decreased endurance, Increased edema, Decreased activity tolerance,  Decreased strength, Pain, Difficulty walking, Decreased range of motion, Postural dysfunction, Impaired flexibility  Visit Diagnosis: Acute pain of left knee  Stiffness of left knee, not elsewhere classified  Muscle weakness (generalized)  Difficulty in walking, not elsewhere classified     Problem List Patient Active Problem List   Diagnosis Date Noted  . Right knee pain 06/15/2017  . Onychomycosis due to dermatophyte 04/09/2017  . Tinea pedis of both feet 04/09/2017  . 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 Fuchs' corneal dystrophy 12/27/2015  . Leg pain 12/27/2015  . Macular retinal cyst of left eye 12/27/2015  . Migraine headache 12/27/2015  . Nuclear sclerotic cataract of both eyes 12/27/2015    PHYSICAL THERAPY DISCHARGE SUMMARY  Visits from Start of Care: 13  Current functional level related to goals / functional outcomes: See above clinical imprression   Remaining deficits: None   Education / Equipment: HEP  Plan: Patient agrees to discharge.  Patient goals were met. Patient is being discharged due to meeting the stated rehab goals.  ?????     Janene Harvey, PT, DPT 04/11/18 4:13 PM   [facility] [address]  [place], [place], [address] Phone: [phone]   Fax:  [phone]  Name: Valina Maes MRN: [id number] Date of Birth: 1965-11-02

## 2018-04-15 ENCOUNTER — Ambulatory Visit: Payer: BLUE CROSS/BLUE SHIELD

## 2018-04-17 ENCOUNTER — Encounter: Payer: BLUE CROSS/BLUE SHIELD | Admitting: Physical Therapy

## 2018-04-18 ENCOUNTER — Encounter: Payer: BLUE CROSS/BLUE SHIELD | Admitting: Physical Therapy

## 2018-05-27 DIAGNOSIS — L700 Acne vulgaris: Secondary | ICD-10-CM | POA: Diagnosis not present

## 2018-06-01 DIAGNOSIS — R072 Precordial pain: Secondary | ICD-10-CM | POA: Diagnosis not present

## 2018-06-01 DIAGNOSIS — R079 Chest pain, unspecified: Secondary | ICD-10-CM | POA: Diagnosis not present

## 2018-06-01 DIAGNOSIS — F419 Anxiety disorder, unspecified: Secondary | ICD-10-CM | POA: Diagnosis not present

## 2018-06-19 ENCOUNTER — Encounter: Payer: Self-pay | Admitting: Family Medicine

## 2018-06-19 ENCOUNTER — Ambulatory Visit: Payer: BLUE CROSS/BLUE SHIELD | Admitting: Family Medicine

## 2018-06-19 VITALS — BP 136/87 | HR 63 | Ht 61.0 in | Wt 184.0 lb

## 2018-06-19 DIAGNOSIS — M25561 Pain in right knee: Secondary | ICD-10-CM

## 2018-06-19 MED ORDER — DICLOFENAC SODIUM 75 MG PO TBEC: 75.0000 mg | DELAYED_RELEASE_TABLET | Freq: Two times a day (BID) | ORAL | 1 refills | Status: DC

## 2018-06-19 NOTE — Patient Instructions (Signed)
You flared up the arthritis in your knee. Take diclofenac twice a day with food for pain and inflammation - take for a week then as needed (you can take as needed sooner if your pain is gone). Don't take aleve, ibuprofen, or the meloxicam with this. Elevate above your heart as needed for swelling. Icing 15 minutes at a time 3-4 times a day. Let me know if this isn't improving over the next week or so and we can consider an injection - I think you'll get better without this. Follow up with me as needed otherwise.

## 2018-06-20 ENCOUNTER — Encounter: Payer: Self-pay | Admitting: Family Medicine

## 2018-06-20 NOTE — Progress Notes (Signed)
PCP: Patient, No Pcp Per  Subjective:   HPI: Patient is a 53 y.o. female here for right knee pain.  Patient has known history of arthritis of her right knee. She reports she's been more active and working out doing classes, half-squats. No acute injury but noticed yesterday pain started worsening anterior right knee and bothered more with driving. Pain level 4/10 and sharp. Tried tiger balm topically. Pain radiating up and down leg. No taking any oral medication for this. No skin changes, numbness. Was swollen yesterday.  History reviewed. No pertinent past medical history.  Current Outpatient Medications on File Prior to Visit  Medication Sig Dispense Refill  . amLODipine (NORVASC) 5 MG tablet Take 1 tablet (5 mg total) by mouth daily. 30 tablet 0  . aspirin EC 81 MG tablet Take 81 mg by mouth.    . Dapsone 5 % topical gel     . spironolactone (ALDACTONE) 50 MG tablet TK 1 T PO D  2   No current facility-administered medications on file prior to visit.     Past Surgical History:  Procedure Laterality Date  . CESAREAN SECTION    . VARICOSE VEIN SURGERY      Allergies  Allergen Reactions  . Codeine     migraines    Social History   Socioeconomic History  . Marital status: Widowed    Spouse name: Not on file  . Number of children: Not on file  . Years of education: Not on file  . Highest education level: Not on file  Occupational History  . Not on file  Social Needs  . Financial resource strain: Not on file  . Food insecurity:    Worry: Not on file    Inability: Not on file  . Transportation needs:    Medical: Not on file    Non-medical: Not on file  Tobacco Use  . Smoking status: Never Smoker  . Smokeless tobacco: Never Used  Substance and Sexual Activity  . Alcohol use: Yes  . Drug use: No  . Sexual activity: Not on file  Lifestyle  . Physical activity:    Days per week: Not on file    Minutes per session: Not on file  . Stress: Not on file   Relationships  . Social connections:    Talks on phone: Not on file    Gets together: Not on file    Attends religious service: Not on file    Active member of club or organization: Not on file    Attends meetings of clubs or organizations: Not on file    Relationship status: Not on file  . Intimate partner violence:    Fear of current or ex partner: Not on file    Emotionally abused: Not on file    Physically abused: Not on file    Forced sexual activity: Not on file  Other Topics Concern  . Not on file  Social History Narrative  . Not on file    History reviewed. No pertinent family history.  BP 136/87   Pulse 63   Ht 5\' 1"  (1.549 m)   Wt 184 lb (83.5 kg)   BMI 34.77 kg/m   Review of Systems: See HPI above.     Objective:  Physical Exam:  Gen: NAD, comfortable in exam room  Right knee: No gross deformity, ecchymoses, effusion. TTP post patellar facets.  No other tenderness. FROM with 5/5 strength flexion and extension. Negative ant/post drawers. Negative valgus/varus testing. Negative  lachmans. Negative mcmurrays, apleys, patellar apprehension. NV intact distally.  Left knee: No deformity. FROM with 5/5 strength. No tenderness to palpation. NVI distally.   Assessment & Plan:  1. Right knee pain - no acute injury.  History, exam consistent with flare of arthritis specifically of patellofemoral compartment.  Start diclofenac twice a day.  Icing, relative rest.  F/u prn if doing well.  Avoid squats, lunges, leg press.  Consider injection if not improving.

## 2018-07-09 ENCOUNTER — Encounter (HOSPITAL_BASED_OUTPATIENT_CLINIC_OR_DEPARTMENT_OTHER): Payer: Self-pay | Admitting: Emergency Medicine

## 2018-07-09 ENCOUNTER — Emergency Department (HOSPITAL_BASED_OUTPATIENT_CLINIC_OR_DEPARTMENT_OTHER): Payer: BLUE CROSS/BLUE SHIELD

## 2018-07-09 ENCOUNTER — Emergency Department (HOSPITAL_BASED_OUTPATIENT_CLINIC_OR_DEPARTMENT_OTHER)
Admission: EM | Admit: 2018-07-09 | Discharge: 2018-07-09 | Disposition: A | Discharge status: Home / Self Care | Payer: BLUE CROSS/BLUE SHIELD | Attending: Emergency Medicine | Admitting: Emergency Medicine

## 2018-07-09 ENCOUNTER — Other Ambulatory Visit: Payer: Self-pay

## 2018-07-09 DIAGNOSIS — Z7982 Long term (current) use of aspirin: Secondary | ICD-10-CM | POA: Diagnosis not present

## 2018-07-09 DIAGNOSIS — Z79899 Other long term (current) drug therapy: Secondary | ICD-10-CM | POA: Insufficient documentation

## 2018-07-09 DIAGNOSIS — I10 Essential (primary) hypertension: Secondary | ICD-10-CM | POA: Diagnosis not present

## 2018-07-09 DIAGNOSIS — I517 Cardiomegaly: Secondary | ICD-10-CM | POA: Diagnosis not present

## 2018-07-09 DIAGNOSIS — R0789 Other chest pain: Secondary | ICD-10-CM | POA: Diagnosis not present

## 2018-07-09 DIAGNOSIS — R42 Dizziness and giddiness: Secondary | ICD-10-CM

## 2018-07-09 DIAGNOSIS — R030 Elevated blood-pressure reading, without diagnosis of hypertension: Secondary | ICD-10-CM

## 2018-07-09 LAB — COMPREHENSIVE METABOLIC PANEL
ALBUMIN: 4.1 g/dL (ref 3.5–5.0)
ALK PHOS: 62 U/L (ref 38–126)
ALT: 27 U/L (ref 0–44)
AST: 27 U/L (ref 15–41)
Anion gap: 6 (ref 5–15)
BUN: 12 mg/dL (ref 6–20)
CALCIUM: 9.1 mg/dL (ref 8.9–10.3)
CO2: 26 mmol/L (ref 22–32)
CREATININE: 0.63 mg/dL (ref 0.44–1.00)
Chloride: 105 mmol/L (ref 98–111)
GFR calc Af Amer: >60 mL/min (ref >60)
GFR calc non Af Amer: >60 mL/min (ref >60)
GLUCOSE: 95 mg/dL (ref 70–99)
Potassium: 3.4 mmol/L — ABNORMAL LOW (ref 3.5–5.1)
SODIUM: 137 mmol/L (ref 135–145)
Total Bilirubin: 0.7 mg/dL (ref 0.3–1.2)
Total Protein: 7.7 g/dL (ref 6.5–8.1)

## 2018-07-09 LAB — CBC WITH DIFFERENTIAL/PLATELET
ABS IMMATURE GRANULOCYTES: 0.01 10*3/uL (ref 0.00–0.07)
BASOS ABS: 0.1 10*3/uL (ref 0.0–0.1)
Basophils Relative: 1 %
Eosinophils Absolute: 0.2 10*3/uL (ref 0.0–0.5)
Eosinophils Relative: 4 %
HCT: 37 % (ref 36.0–46.0)
HEMOGLOBIN: 11.3 g/dL — AB (ref 12.0–15.0)
Immature Granulocytes: 0 %
LYMPHS ABS: 1.9 10*3/uL (ref 0.7–4.0)
Lymphocytes Relative: 38 %
MCH: 26.8 pg (ref 26.0–34.0)
MCHC: 30.5 g/dL (ref 30.0–36.0)
MCV: 87.9 fL (ref 80.0–100.0)
MONO ABS: 0.5 10*3/uL (ref 0.1–1.0)
Monocytes Relative: 10 %
NEUTROS ABS: 2.3 10*3/uL (ref 1.7–7.7)
Neutrophils Relative %: 47 %
Platelets: 170 10*3/uL (ref 150–400)
RBC: 4.21 MIL/uL (ref 3.87–5.11)
RDW: 13.2 % (ref 11.5–15.5)
WBC: 4.9 10*3/uL (ref 4.0–10.5)
nRBC: 0 % (ref 0.0–0.2)

## 2018-07-09 LAB — TROPONIN I
Troponin I: <0.03 ng/mL (ref <0.03)
Troponin I: <0.03 ng/mL (ref <0.03)

## 2018-07-09 MED ORDER — AMLODIPINE BESYLATE 5 MG PO TABS: 5.0000 mg | ORAL_TABLET | Freq: Every day | ORAL | 0 refills | Status: DC

## 2018-07-09 NOTE — Discharge Instructions (Signed)
Your work-up today was overall reassuring.  Your labs and imaging showed no significant abnormalities.  We suspect your symptoms were due to the elevated blood pressure you had earlier today.  As your blood pressure has improved, your symptoms resolved.  As you are out of your blood pressure medication, we will refill it today.  Please follow-up with a primary care physician to get established and further manage your blood pressure problems.  If any symptoms change or worsen acutely, please return to the nearest emergency department.

## 2018-07-09 NOTE — ED Provider Notes (Signed)
[facility] EMERGENCY DEPARTMENT Provider Note   CSN: [id number] Arrival date & time: 07/09/18  8527    History   Chief Complaint Chief Complaint  Patient presents with  . Hypertension    HPI Tanya Kelly is a 53 y.o. female.     The history is provided by the patient and medical records. No language interpreter was used.  Hypertension  This is a recurrent problem. The current episode started 12 to 24 hours ago. The problem occurs constantly. The problem has been rapidly improving. Associated symptoms include chest pain (tightness). Pertinent negatives include no abdominal pain, no headaches and no shortness of breath. Nothing aggravates the symptoms. The symptoms are relieved by rest. She has tried nothing for the symptoms. The treatment provided no relief.    History reviewed. No pertinent past medical history.  Patient Active Problem List   Diagnosis Date Noted  . Right knee pain 06/15/2017  . Onychomycosis due to dermatophyte 04/09/2017  . Tinea pedis of both feet 04/09/2017  . 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 Fuchs' corneal dystrophy 12/27/2015  . Leg pain 12/27/2015  . Macular retinal cyst of left eye 12/27/2015  . Migraine headache 12/27/2015  . Nuclear sclerotic cataract of both eyes 12/27/2015    Past Surgical History:  Procedure Laterality Date  . CESAREAN SECTION    . VARICOSE VEIN SURGERY       OB History   No obstetric history on file.      Home Medications    Prior to Admission medications   Medication Sig Start Date End Date Taking? Authorizing Provider  amLODipine (NORVASC) 5 MG tablet Take 1 tablet (5 mg total) by mouth daily. 02/02/18   Molpus, John, MD  aspirin EC 81 MG tablet Take 81 mg by mouth.    [provider]  Dapsone 5 % topical gel  06/08/18   [provider]  diclofenac (VOLTAREN) 75 MG EC tablet Take 1 tablet (75 mg total) by mouth 2 (two) times daily. 06/19/18   Lenda Kelp, MD    spironolactone (ALDACTONE) 50 MG tablet TK 1 T PO D 02/27/18   [provider]    Family History History reviewed. No pertinent family history.  Social History Social History   Tobacco Use  . Smoking status: Never Smoker  . Smokeless tobacco: Never Used  Substance Use Topics  . Alcohol use: Yes  . Drug use: No     Allergies   Codeine   Review of Systems Review of Systems  Constitutional: Positive for diaphoresis and fatigue. Negative for chills and fever.  HENT: Negative for congestion.   Eyes: Negative for photophobia and visual disturbance.  Respiratory: Positive for chest tightness. Negative for cough, shortness of breath and wheezing.   Cardiovascular: Positive for chest pain (tightness).  Gastrointestinal: Negative for abdominal pain, constipation, diarrhea, nausea and vomiting.  Genitourinary: Negative for dysuria and flank pain.  Musculoskeletal: Negative for back pain, neck pain and neck stiffness.  Skin: Negative for rash and wound.  Neurological: Positive for light-headedness. Negative for dizziness, seizures, syncope, speech difficulty, weakness, numbness and headaches.  Psychiatric/Behavioral: Negative for agitation and confusion.  All other systems reviewed and are negative.    Physical Exam Updated Vital Signs BP (!) 143/94 (BP Location: Right Arm)   Pulse 61   Temp 98 F (36.7 C) (Oral)   Resp 16   Ht 5\' 1"  (1.549 m)   Wt 83 kg   SpO2 99%  BMI 34.58 kg/m   Physical Exam Vitals signs and nursing note reviewed.  Constitutional:      General: She is not in acute distress.    Appearance: She is well-developed. She is not ill-appearing, toxic-appearing or diaphoretic.  HENT:     Head: Normocephalic and atraumatic.     Nose: No congestion or rhinorrhea.     Mouth/Throat:     Pharynx: No oropharyngeal exudate or posterior oropharyngeal erythema.  Eyes:     Conjunctiva/sclera: Conjunctivae normal.  Neck:     Musculoskeletal: Neck  supple. No muscular tenderness.  Cardiovascular:     Rate and Rhythm: Normal rate and regular rhythm.     Heart sounds: No murmur.  Pulmonary:     Effort: Pulmonary effort is normal. No respiratory distress.     Breath sounds: Normal breath sounds. No stridor. No wheezing, rhonchi or rales.  Chest:     Chest wall: Tenderness present.  Abdominal:     General: Abdomen is flat. There is no distension.     Palpations: Abdomen is soft.     Tenderness: There is no abdominal tenderness.  Musculoskeletal:        General: No tenderness.     Right lower leg: No edema.     Left lower leg: No edema.  Skin:    General: Skin is warm and dry.     Capillary Refill: Capillary refill takes less than 2 seconds.  Neurological:     General: No focal deficit present.     Mental Status: She is alert and oriented to person, place, and time.     Sensory: No sensory deficit.     Motor: No weakness.  Psychiatric:        Mood and Affect: Mood normal.      ED Treatments / Results  Labs (all labs ordered are listed, but only abnormal results are displayed) Labs Reviewed  CBC WITH DIFFERENTIAL/PLATELET - Abnormal; Notable for the following components:      Result Value   Hemoglobin 11.3 (*)    All other components within normal limits  COMPREHENSIVE METABOLIC PANEL - Abnormal; Notable for the following components:   Potassium 3.4 (*)    All other components within normal limits  TROPONIN I  TROPONIN I    EKG EKG Interpretation  Date/Time:  Tuesday July 09 2018 07:52:44 EST Ventricular Rate:  62 PR Interval:    QRS Duration: 79 QT Interval:  398 QTC Calculation: 405 R Axis:   56 Text Interpretation:  Sinus rhythm When compaared to prior, no significant changes seen.  no STEMI Confirmed by Theda Belfast ([id number]) on 07/09/2018 8:04:56 AM   Radiology Dg Chest 2 View  Result Date: 07/09/2018 CLINICAL DATA:  Dizziness. EXAM: CHEST - 2 VIEW COMPARISON:  Chest x-ray dated June 01, 2018.  FINDINGS: Stable mild cardiomegaly. Normal pulmonary vascularity. No focal consolidation, pleural effusion, or pneumothorax. No acute osseous abnormality. IMPRESSION: 1. Stable mild cardiomegaly.  No active cardiopulmonary disease. Electronically Signed   By: Obie Dredge M.D.   On: 07/09/2018 08:34    Procedures Procedures (including critical care time)  Medications Ordered in ED Medications - No data to display   Initial Impression / Assessment and Plan / ED Course  I have reviewed the triage vital signs and the nursing notes.  Pertinent labs & imaging results that were available during my care of the patient were reviewed by me and considered in my medical decision making (see chart for details).  Tanya Kelly is a 53 y.o. female with a past medical history significant for migraine headaches and previously treated hypertension who presents with elevated blood pressure, chest tightness, lightheadedness, and fatigue.  Patient reports that today at work she was feeling lightheaded and had some chest tightness.  She reports no significant shortness of breath but says she had some diaphoresis.  She sat down and had a blood pressure checked at work and it was reportedly 183/124.  Patient then was able to rest and improved into the 140s.  Patient presents for evaluation for her symptoms.  She reports she is otherwise been doing well with no recent fevers, chills, congestion, cough.  She denies recent palpitations.  No recent urinary symptoms GI symptoms or other complaints.  She reports that she was previously on several months of amlodipine for high blood pressure but she never followed up with a PCP and never had her medications restarted.  Patient otherwise is chest pain-free and resting comfortably on arrival.  Patient's blood pressure was 130/90.  Of note, patient's fianc reports that they did a significant chest focused exercise regimen yesterday and he thinks he may have muscle  soreness on her chest.  On my exam, patient has no complaints.  Lungs were clear and chest was mildly tender to palpation..  Abdomen was nontender.  Normal pulses in all extremities.  No lower extremity edema or tenderness.  Patient had no back tenderness or neck tenderness.  Vital signs reassuring on arrival with again blood pressure of 130/90 on my personal evaluation.  EKG shows no STEMI or other abnormality.  Clinical aspect patient had some symptoms related to elevated blood pressure with the lightheadedness sensation today.  I suspect her chest discomfort was related to the exercise regimen and weightlifting yesterday given the chest tenderness.  However, patient will have labs and chest x-ray to further evaluate.  If work-up is reassuring, anticipate restarting her home blood pressure medicine and follow-up with PCP for further blood pressure management.  Laboratory testing was all reassuring.  Delta troponin negative.  EKG reassuring.  Other labs similar to prior or improved.  Patient had stable but mild cardiomegaly which he was informed.  Given improvement in symptoms, suspect patient symptoms were related to her elevated blood pressure transiently this morning.  As she has had stable blood pressures here in the normal range, do not feel she needs acute admission or intervention.  She will however be restarted on her home blood pressure medicine and follow-up with PCP.  Patient agreed with this plan of care and was discharged in good condition with resolved symptoms.    Final Clinical Impressions(s) / ED Diagnoses   Final diagnoses:  Episodic lightheadedness  Elevated blood pressure reading    ED Discharge Orders         Ordered    amLODipine (NORVASC) 5 MG tablet  Daily     07/09/18 1312         Clinical Impression: 1. Episodic lightheadedness   2. Elevated blood pressure reading     Disposition: Discharge  Condition: Good  I have discussed the results, Dx and Tx  plan with the pt(& family if present). He/she/they expressed understanding and agree(s) with the plan. Discharge instructions discussed at great length. Strict return precautions discussed and pt &/or family have verbalized understanding of the instructions. No further questions at time of discharge.    Discharge Medication List as of 07/09/2018  1:12 PM    START taking these medications  Details  !! amLODipine (NORVASC) 5 MG tablet Take 1 tablet (5 mg total) by mouth daily., Starting Tue 07/09/2018, Print     !! - Potential duplicate medications found. Please discuss with provider.      Follow Up: [facility] [address] [place] [address] [phone] Schedule an appointment as soon as possible for a visit    [facility] [place] [facility] [address] [id number] [place] [address] [phone]       Cecillia Menees, Canary Brim, MD 07/09/18 1520

## 2018-07-09 NOTE — ED Triage Notes (Addendum)
Reports to ER for hypertension.  States BP at home was 140/102.  C/o dizziness since this morning.  Denies chest pain, shortness of breath.    Reports that she was given RX for 3 months from the ED for amlodipine but ran out and states she was not instructed to continue this medication.  States she last had this medication in October.

## 2018-07-09 NOTE — ED Triage Notes (Signed)
Initially denied chest pain.  Upon asking pain scale reports pain 5/10 in chest.

## 2018-07-25 ENCOUNTER — Ambulatory Visit: Payer: BLUE CROSS/BLUE SHIELD | Admitting: Family Medicine

## 2018-07-25 ENCOUNTER — Other Ambulatory Visit: Payer: Self-pay

## 2018-07-25 ENCOUNTER — Encounter: Payer: Self-pay | Admitting: Family Medicine

## 2018-07-25 VITALS — BP 107/74 | HR 80 | Temp 98.2°F | Ht 61.0 in | Wt 180.0 lb

## 2018-07-25 DIAGNOSIS — M25562 Pain in left knee: Secondary | ICD-10-CM | POA: Diagnosis not present

## 2018-07-25 MED ORDER — METHYLPREDNISOLONE ACETATE 40 MG/ML IJ SUSP
40.0000 mg | Freq: Once | INTRAMUSCULAR | Status: AC
  Administered 2018-07-25: 40 mg via INTRA_ARTICULAR

## 2018-07-25 NOTE — Patient Instructions (Signed)
Your pain is due to arthritis. These are the different medications you can take for this: Tylenol 500mg  1-2 tabs three times a day for pain. Capsaicin, aspercreme, or biofreeze topically up to four times a day may also help with pain. Some supplements that may help for arthritis: Boswellia extract, curcumin, pycnogenol Diclofenac twice a day with food Cortisone injections are an option - you were given this today. If cortisone injections do not help, there are different types of shots that may help but they take longer to take effect. It's important that you continue to stay active. Straight leg raises, knee extensions 3 sets of 10 once a day (add ankle weight if these become too easy). Consider physical therapy to strengthen muscles around the joint that hurts to take pressure off of the joint itself. Shoe inserts with good arch support may be helpful. Compression sleeve. Ice 15 minutes at a time 3-4 times a day as needed to help with pain. Water aerobics and cycling with low resistance are the best two types of exercise for arthritis though any exercise is ok as long as it doesn't worsen the pain. Follow up with me in 1 month or as needed.

## 2018-07-26 ENCOUNTER — Encounter: Payer: Self-pay | Admitting: Family Medicine

## 2018-07-26 NOTE — Progress Notes (Signed)
PCP: Patient, No Pcp Per  Subjective:   HPI: Patient is a 53 y.o. female here for left knee pain.  01/01/18: Patient reports for about 2 weeks she's had fairly severe medial left knee pain. Pain level 10/10 and sharp. Difficulty sleeping at night due to pain. Feels like going to give out as well. Tried topical muscle rub, biofreeze, heat that helped only temporarily. No skin changes, numbness.  07/25/18: Patient reports her pain in left knee has worsened over past 2 weeks. Associated swelling, anterior pain radiating to back of knee. Feels a cramping behind knee. Pain up to 10/10 at worst. Difficulty sleeping as a result. Taking diclofenac. No numbness, skin changes.  History reviewed. No pertinent past medical history.  Current Outpatient Medications on File Prior to Visit  Medication Sig Dispense Refill  . amLODipine (NORVASC) 5 MG tablet Take 1 tablet (5 mg total) by mouth daily. 30 tablet 0  . aspirin EC 81 MG tablet Take 81 mg by mouth.    . Dapsone 5 % topical gel     . diclofenac (VOLTAREN) 75 MG EC tablet Take 1 tablet (75 mg total) by mouth 2 (two) times daily. 60 tablet 1  . minocycline (MINOCIN,DYNACIN) 100 MG capsule TK 1 C PO D    . spironolactone (ALDACTONE) 50 MG tablet TK 1 T PO D  2  . SULFACLEANSE 8/4 8-4 % SUSP      No current facility-administered medications on file prior to visit.     Past Surgical History:  Procedure Laterality Date  . CESAREAN SECTION    . VARICOSE VEIN SURGERY      Allergies  Allergen Reactions  . Codeine     migraines    Social History   Socioeconomic History  . Marital status: Widowed    Spouse name: Not on file  . Number of children: Not on file  . Years of education: Not on file  . Highest education level: Not on file  Occupational History  . Not on file  Social Needs  . Financial resource strain: Not on file  . Food insecurity:    Worry: Not on file    Inability: Not on file  . Transportation needs:   Medical: Not on file    Non-medical: Not on file  Tobacco Use  . Smoking status: Never Smoker  . Smokeless tobacco: Never Used  Substance and Sexual Activity  . Alcohol use: Yes  . Drug use: No  . Sexual activity: Not on file  Lifestyle  . Physical activity:    Days per week: Not on file    Minutes per session: Not on file  . Stress: Not on file  Relationships  . Social connections:    Talks on phone: Not on file    Gets together: Not on file    Attends religious service: Not on file    Active member of club or organization: Not on file    Attends meetings of clubs or organizations: Not on file    Relationship status: Not on file  . Intimate partner violence:    Fear of current or ex partner: Not on file    Emotionally abused: Not on file    Physically abused: Not on file    Forced sexual activity: Not on file  Other Topics Concern  . Not on file  Social History Narrative  . Not on file    History reviewed. No pertinent family history.  BP 107/74   Pulse 80  Temp 98.2 F (36.8 C) (Oral)   Ht 5\' 1"  (1.549 m)   Wt 180 lb (81.6 kg)   BMI 34.01 kg/m   Review of Systems: See HPI above.     Objective:  Physical Exam:  Gen: NAD, comfortable in exam room  Left knee: Mild effusion.  No other gross deformity, ecchymoses. TTP medial joint line.  No other tenderness. FROM with 5/5 strength flexion and extension. Negative ant/post drawers. Negative valgus/varus testing. Negative lachmans. Negative mcmurrays, apleys, patellar apprehension. NV intact distally.   Assessment & Plan:  1. Left knee pain - 2/2 flare of arthritis, effusion.  Repeated injection today.  Icing, tylenol, topical medications, supplements reviewed, continue diclofenac.  Compression sleeve.  F/u in 1 month or prn.  After informed written consent timeout was performed, patient was lying supine on exam table. Left knee was prepped with alcohol swab and utilizing superolateral approach with  ultrasound guidance, patient's left knee was injected intraarticularly with 3:1 bupivicaine: depomedrol. Patient tolerated the procedure well without immediate complications.

## 2018-08-19 ENCOUNTER — Emergency Department (HOSPITAL_BASED_OUTPATIENT_CLINIC_OR_DEPARTMENT_OTHER)
Admission: EM | Admit: 2018-08-19 | Discharge: 2018-08-20 | Disposition: A | Discharge status: Home / Self Care | Payer: BLUE CROSS/BLUE SHIELD | Attending: Emergency Medicine | Admitting: Emergency Medicine

## 2018-08-19 ENCOUNTER — Encounter (HOSPITAL_BASED_OUTPATIENT_CLINIC_OR_DEPARTMENT_OTHER): Payer: Self-pay | Admitting: Emergency Medicine

## 2018-08-19 ENCOUNTER — Other Ambulatory Visit: Payer: Self-pay

## 2018-08-19 DIAGNOSIS — R079 Chest pain, unspecified: Secondary | ICD-10-CM

## 2018-08-19 DIAGNOSIS — Z7982 Long term (current) use of aspirin: Secondary | ICD-10-CM | POA: Diagnosis not present

## 2018-08-19 DIAGNOSIS — Z79899 Other long term (current) drug therapy: Secondary | ICD-10-CM | POA: Diagnosis not present

## 2018-08-19 DIAGNOSIS — R0789 Other chest pain: Secondary | ICD-10-CM | POA: Diagnosis not present

## 2018-08-19 MED ORDER — SODIUM CHLORIDE 0.9% FLUSH
3.0000 mL | Freq: Once | INTRAVENOUS | Status: DC
  Filled 2018-08-19: qty 3

## 2018-08-19 NOTE — ED Triage Notes (Signed)
Pt c/o 8/10 cp for the past few days getting worse today, pt denies any traveling, SOB, cough, denies any change on taste or smell.

## 2018-08-20 ENCOUNTER — Emergency Department (HOSPITAL_BASED_OUTPATIENT_CLINIC_OR_DEPARTMENT_OTHER): Payer: BLUE CROSS/BLUE SHIELD

## 2018-08-20 DIAGNOSIS — R079 Chest pain, unspecified: Secondary | ICD-10-CM | POA: Diagnosis not present

## 2018-08-20 LAB — BASIC METABOLIC PANEL
Anion gap: 3 — ABNORMAL LOW (ref 5–15)
BUN: 15 mg/dL (ref 6–20)
CO2: 24 mmol/L (ref 22–32)
Calcium: 9.1 mg/dL (ref 8.9–10.3)
Chloride: 106 mmol/L (ref 98–111)
Creatinine, Ser: 0.65 mg/dL (ref 0.44–1.00)
GFR calc Af Amer: >60 mL/min (ref >60)
GFR calc non Af Amer: >60 mL/min (ref >60)
Glucose, Bld: 96 mg/dL (ref 70–99)
Potassium: 3.5 mmol/L (ref 3.5–5.1)
Sodium: 133 mmol/L — ABNORMAL LOW (ref 135–145)

## 2018-08-20 LAB — CBC
HCT: 35 % — ABNORMAL LOW (ref 36.0–46.0)
Hemoglobin: 10.7 g/dL — ABNORMAL LOW (ref 12.0–15.0)
MCH: 27.1 pg (ref 26.0–34.0)
MCHC: 30.6 g/dL (ref 30.0–36.0)
MCV: 88.6 fL (ref 80.0–100.0)
Platelets: 186 10*3/uL (ref 150–400)
RBC: 3.95 MIL/uL (ref 3.87–5.11)
RDW: 13.3 % (ref 11.5–15.5)
WBC: 5.8 10*3/uL (ref 4.0–10.5)
nRBC: 0 % (ref 0.0–0.2)

## 2018-08-20 LAB — TROPONIN I: Troponin I: <0.03 ng/mL (ref <0.03)

## 2018-08-20 MED ORDER — AMLODIPINE BESYLATE 5 MG PO TABS: 5.0000 mg | ORAL_TABLET | Freq: Every day | ORAL | 1 refills | Status: AC

## 2018-08-20 MED ORDER — NITROGLYCERIN 0.4 MG SL SUBL
0.4000 mg | SUBLINGUAL_TABLET | SUBLINGUAL | Status: DC | PRN
  Administered 2018-08-20: 01:00:00 0.4 mg via SUBLINGUAL
  Filled 2018-08-20: qty 1

## 2018-08-20 MED ORDER — ASPIRIN 81 MG PO CHEW
324.0000 mg | CHEWABLE_TABLET | Freq: Once | ORAL | Status: AC
  Administered 2018-08-20: 324 mg via ORAL
  Filled 2018-08-20: qty 4

## 2018-08-20 NOTE — ED Notes (Signed)
Patient transported to X-ray 

## 2018-08-20 NOTE — ED Provider Notes (Signed)
[facility] EMERGENCY DEPARTMENT Provider Note   CSN: [id number] Arrival date & time: 08/19/18  2343    History   Chief Complaint Chief Complaint  Patient presents with  . Chest Pain    HPI Tanya Kelly is a 53 y.o. female.  HPI: A 53 year old patient with a history of hypertension and obesity presents for evaluation of chest pain. Initial onset of pain was more than 6 hours ago. The patient's chest pain is described as heaviness/pressure/tightness, is sharp and is not worse with exertion. The patient's chest pain is middle- or left-sided, is not well-localized and does radiate to the arms/jaw/neck. The patient does not complain of nausea and denies diaphoresis. The patient has no history of stroke, has no history of peripheral artery disease, has not smoked in the past 90 days, denies any history of treated diabetes, has no relevant family history of coronary artery disease (first degree relative at less than age 50) and has no history of hypercholesterolemia.    Chest Pain  Timing:  Constant Progression:  Worsening Associated symptoms: no abdominal pain, no cough, no diaphoresis, no fatigue, no fever, no nausea, no shortness of breath, no syncope and no weakness     History reviewed. No pertinent past medical history.  Patient Active Problem List   Diagnosis Date Noted  . Right knee pain 06/15/2017  . Onychomycosis due to dermatophyte 04/09/2017  . Tinea pedis of both feet 04/09/2017  . 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 Fuchs' corneal dystrophy 12/27/2015  . Leg pain 12/27/2015  . Macular retinal cyst of left eye 12/27/2015  . Migraine headache 12/27/2015  . Nuclear sclerotic cataract of both eyes 12/27/2015    Past Surgical History:  Procedure Laterality Date  . CESAREAN SECTION    . VARICOSE VEIN SURGERY       OB History   No obstetric history on file.      Home Medications    Prior to Admission medications   Medication Sig Start  Date End Date Taking? Authorizing Provider  amLODipine (NORVASC) 5 MG tablet Take 1 tablet (5 mg total) by mouth daily. 08/20/18   Caryn Gienger, Barbara Cower, MD  aspirin EC 81 MG tablet Take 81 mg by mouth.    [provider]  Dapsone 5 % topical gel  06/08/18   [provider]  diclofenac (VOLTAREN) 75 MG EC tablet Take 1 tablet (75 mg total) by mouth 2 (two) times daily. 06/19/18   Lenda Kelp, MD  minocycline (MINOCIN,DYNACIN) 100 MG capsule TK 1 C PO D 07/22/18   [provider]  spironolactone (ALDACTONE) 50 MG tablet TK 1 T PO D 02/27/18   [provider]  SULFACLEANSE 8/4 8-4 % SUSP  07/01/18   [provider]    Family History No family history on file.  Social History Social History   Tobacco Use  . Smoking status: Never Smoker  . Smokeless tobacco: Never Used  Substance Use Topics  . Alcohol use: Yes  . Drug use: No     Allergies   Codeine   Review of Systems Review of Systems  Constitutional: Negative for diaphoresis, fatigue and fever.  Respiratory: Negative for cough and shortness of breath.   Cardiovascular: Positive for chest pain. Negative for syncope.  Gastrointestinal: Negative for abdominal pain and nausea.  Neurological: Negative for weakness.  All other systems reviewed and are negative.    Physical Exam Updated Vital Signs BP 109/77   Pulse 60  Temp 98.3 F (36.8 C) (Oral)   Resp (!) 22   Ht  (1.549 m)   Wt 81.6 kg   LMP 05/22/2018   SpO2 98%   BMI 34.01 kg/m   Physical Exam Vitals signs and nursing note reviewed.  Constitutional:      Appearance: She is well-developed. She is not ill-appearing.  HENT:     Head: Normocephalic and atraumatic.     Nose: Nose normal.     Mouth/Throat:     Mouth: Mucous membranes are dry.     Pharynx: Oropharynx is clear.  Eyes:     Extraocular Movements: Extraocular movements intact.     Conjunctiva/sclera: Conjunctivae normal.  Neck:     Musculoskeletal:  Normal range of motion.  Cardiovascular:     Rate and Rhythm: Normal rate and regular rhythm.  Pulmonary:     Effort: No respiratory distress.     Breath sounds: No stridor.  Abdominal:     General: There is no distension.  Musculoskeletal: Normal range of motion.        General: No swelling or tenderness.  Skin:    General: Skin is warm and dry.  Neurological:     General: No focal deficit present.     Mental Status: She is alert.      ED Treatments / Results  Labs (all labs ordered are listed, but only abnormal results are displayed) Labs Reviewed  BASIC METABOLIC PANEL - Abnormal; Notable for the following components:      Result Value   Sodium 133 (*)    Anion gap 3 (*)    All other components within normal limits  CBC - Abnormal; Notable for the following components:   Hemoglobin 10.7 (*)    HCT 35.0 (*)    All other components within normal limits  TROPONIN I  PREGNANCY, URINE    EKG None  Radiology Dg Chest 2 View  Result Date: 08/20/2018 CLINICAL DATA:  Chest pain for several weeks EXAM: CHEST - 2 VIEW COMPARISON:  07/09/2018 FINDINGS: Cardiac shadow is mildly enlarged but stable. The lungs are well aerated bilaterally. No focal infiltrate or sizable effusion is seen. Mild degenerative changes of the thoracic spine are noted. IMPRESSION: No acute abnormality seen. Electronically Signed   By: Alcide Clever M.D.   On: 08/20/2018 00:39    Procedures Procedures (including critical care time)  Medications Ordered in ED Medications  sodium chloride flush (NS) 0.9 % injection 3 mL (3 mLs Intravenous Not Given 08/20/18 0019)  nitroGLYCERIN (NITROSTAT) SL tablet 0.4 mg (0.4 mg Sublingual Given 08/20/18 0115)  aspirin chewable tablet 324 mg (324 mg Oral Given 08/20/18 0110)     Initial Impression / Assessment and Plan / ED Course  I have reviewed the triage vital signs and the nursing notes.  Pertinent labs & imaging results that were available during my care of  the patient were reviewed by me and considered in my medical decision making (see chart for details).     HEAR Score: 3  Very atypical story for any kind of ACS.  Low risk for PE.  She has pain that is in her chest but also both arms with a sharp pain that radiates down from her neck could be related to cervical issue.  We will have her follow-up with PCP for further work-up and management.  Final Clinical Impressions(s) / ED Diagnoses   Final diagnoses:  Nonspecific chest pain    ED Discharge Orders  Ordered    amLODipine (NORVASC) 5 MG tablet  Daily     08/20/18 0147           Homer Miller, Barbara CowerJason, MD 08/20/18 [phone]

## 2018-08-20 NOTE — ED Notes (Signed)
ED Provider at bedside. 

## 2018-08-31 DIAGNOSIS — R072 Precordial pain: Secondary | ICD-10-CM | POA: Diagnosis not present

## 2018-08-31 DIAGNOSIS — R51 Headache: Secondary | ICD-10-CM | POA: Diagnosis not present

## 2018-08-31 DIAGNOSIS — R079 Chest pain, unspecified: Secondary | ICD-10-CM | POA: Diagnosis not present

## 2018-08-31 DIAGNOSIS — R61 Generalized hyperhidrosis: Secondary | ICD-10-CM | POA: Diagnosis not present

## 2018-08-31 DIAGNOSIS — D649 Anemia, unspecified: Secondary | ICD-10-CM | POA: Diagnosis not present

## 2018-09-01 DIAGNOSIS — R001 Bradycardia, unspecified: Secondary | ICD-10-CM | POA: Diagnosis not present

## 2018-09-04 ENCOUNTER — Other Ambulatory Visit: Payer: Self-pay

## 2018-09-04 ENCOUNTER — Ambulatory Visit: Payer: BLUE CROSS/BLUE SHIELD | Admitting: Family Medicine

## 2018-09-04 ENCOUNTER — Encounter: Payer: Self-pay | Admitting: Family Medicine

## 2018-09-04 VITALS — BP 101/78 | HR 77 | Ht 64.0 in | Wt 180.0 lb

## 2018-09-04 DIAGNOSIS — M25562 Pain in left knee: Secondary | ICD-10-CM

## 2018-09-04 NOTE — Progress Notes (Signed)
PCP: Patient, No Pcp Per  Subjective:   HPI: Patient is a 53 y.o. female here for left knee pain.  01/01/18: Patient reports for about 2 weeks she's had fairly severe medial left knee pain. Pain level 10/10 and sharp. Difficulty sleeping at night due to pain. Feels like going to give out as well. Tried topical muscle rub, biofreeze, heat that helped only temporarily. No skin changes, numbness.  07/25/18: Patient reports her pain in left knee has worsened over past 2 weeks. Associated swelling, anterior pain radiating to back of knee. Feels a cramping behind knee. Pain up to 10/10 at worst. Difficulty sleeping as a result. Taking diclofenac. No numbness, skin changes.  4/29: Patient reports only mild improvement since last visit with injection. Pain level is 4/10 medial left knee up to 10/10 and sharp at nighttime especially. Using thermogesic patches. No skin changes. No new injuries.  History reviewed. No pertinent past medical history.  Current Outpatient Medications on File Prior to Visit  Medication Sig Dispense Refill  . amLODipine (NORVASC) 5 MG tablet Take 1 tablet (5 mg total) by mouth daily. 30 tablet 1  . aspirin EC 81 MG tablet Take 81 mg by mouth.    . Dapsone 5 % topical gel     . diclofenac (VOLTAREN) 75 MG EC tablet Take 1 tablet (75 mg total) by mouth 2 (two) times daily. 60 tablet 1  . minocycline (MINOCIN,DYNACIN) 100 MG capsule TK 1 C PO D    . PREVIDENT 5000 SENSITIVE 1.1-5 % PSTE BRUSH ONTO THE TEETH IN THE MORNING AND AT BEDTIME    . spironolactone (ALDACTONE) 50 MG tablet TK 1 T PO D  2  . SULFACLEANSE 8/4 8-4 % SUSP      No current facility-administered medications on file prior to visit.     Past Surgical History:  Procedure Laterality Date  . CESAREAN SECTION    . VARICOSE VEIN SURGERY      Allergies  Allergen Reactions  . Codeine     migraines    Social History   Socioeconomic History  . Marital status: Widowed    Spouse name:  Not on file  . Number of children: Not on file  . Years of education: Not on file  . Highest education level: Not on file  Occupational History  . Not on file  Social Needs  . Financial resource strain: Not on file  . Food insecurity:    Worry: Not on file    Inability: Not on file  . Transportation needs:    Medical: Not on file    Non-medical: Not on file  Tobacco Use  . Smoking status: Never Smoker  . Smokeless tobacco: Never Used  Substance and Sexual Activity  . Alcohol use: Yes  . Drug use: No  . Sexual activity: Not on file  Lifestyle  . Physical activity:    Days per week: Not on file    Minutes per session: Not on file  . Stress: Not on file  Relationships  . Social connections:    Talks on phone: Not on file    Gets together: Not on file    Attends religious service: Not on file    Active member of club or organization: Not on file    Attends meetings of clubs or organizations: Not on file    Relationship status: Not on file  . Intimate partner violence:    Fear of current or ex partner: Not on file  Emotionally abused: Not on file    Physically abused: Not on file    Forced sexual activity: Not on file  Other Topics Concern  . Not on file  Social History Narrative  . Not on file    History reviewed. No pertinent family history.  BP 101/78   Pulse 77   Ht 5\' 4"  (1.626 m)   Wt 180 lb (81.6 kg)   BMI 30.90 kg/m   Review of Systems: See HPI above.     Objective:  Physical Exam:  Gen: NAD, comfortable in exam room  Left knee: Mild effusion.  No other gross deformity, ecchymoses. TTP medial joint line, less post patellar facets. FROM with 5/5 strength flexion and extension. Negative ant/post drawers. Negative valgus/varus testing. Negative lachmans. Negative mcmurrays, apleys, patellar apprehension. NV intact distally.   Assessment & Plan:  1. Left knee pain - Unfortunately continues to struggle despite physical therapy, steroid  injections, diclofenac, compression sleeve.  Known arthritis that is mild but no other pathology on MRI, radiographs.  Will go ahead with viscosupplementation.  Icing, tylenol, diclofenac, topical medications in meantime.

## 2018-09-04 NOTE — Patient Instructions (Signed)
Your pain is due to arthritis. These are the different medications you can take for this: Tylenol 500mg  1-2 tabs three times a day for pain. Capsaicin, aspercreme, or biofreeze topically up to four times a day may also help with pain. Some supplements that may help for arthritis: Boswellia extract, curcumin, pycnogenol Diclofenac 75mg  twice a day with food for pain and inflammation. We will get approval for the gel injections as the next step. It's important that you continue to stay active. Straight leg raises, knee extensions 3 sets of 10 once a day (add ankle weight if these become too easy). Shoe inserts with good arch support may be helpful. Ice 15 minutes at a time 3-4 times a day as needed to help with pain. Water aerobics and cycling with low resistance are the best two types of exercise for arthritis though any exercise is ok as long as it doesn't worsen the pain.

## 2018-09-06 DIAGNOSIS — R079 Chest pain, unspecified: Secondary | ICD-10-CM | POA: Diagnosis not present

## 2018-09-06 DIAGNOSIS — 419620001 Death: Secondary | SNOMED CT | POA: Diagnosis not present

## 2018-09-06 DEATH — deceased

## 2018-09-11 ENCOUNTER — Other Ambulatory Visit: Payer: Self-pay

## 2018-09-11 ENCOUNTER — Encounter: Payer: Self-pay | Admitting: Family Medicine

## 2018-09-11 ENCOUNTER — Ambulatory Visit: Payer: BLUE CROSS/BLUE SHIELD | Admitting: Family Medicine

## 2018-09-11 DIAGNOSIS — M1712 Unilateral primary osteoarthritis, left knee: Secondary | ICD-10-CM

## 2018-09-11 NOTE — Progress Notes (Signed)
PCP: Patient, No Pcp Per  Subjective:   HPI: Patient is a 53 y.o. female here for left knee pain.  01/01/18: Patient reports for about 2 weeks she's had fairly severe medial left knee pain. Pain level 10/10 and sharp. Difficulty sleeping at night due to pain. Feels like going to give out as well. Tried topical muscle rub, biofreeze, heat that helped only temporarily. No skin changes, numbness.  07/25/18: Patient reports her pain in left knee has worsened over past 2 weeks. Associated swelling, anterior pain radiating to back of knee. Feels a cramping behind knee. Pain up to 10/10 at worst. Difficulty sleeping as a result. Taking diclofenac. No numbness, skin changes.  4/29: Patient reports only mild improvement since last visit with injection. Pain level is 4/10 medial left knee up to 10/10 and sharp at nighttime especially. Using thermogesic patches. No skin changes. No new injuries.  5/6: Patient returns to start viscosupplementation. No skin changes. No new complaints. Left knee pain 5/10.  History reviewed. No pertinent past medical history.  Current Outpatient Medications on File Prior to Visit  Medication Sig Dispense Refill  . amLODipine (NORVASC) 5 MG tablet Take 1 tablet (5 mg total) by mouth daily. 30 tablet 1  . aspirin EC 81 MG tablet Take 81 mg by mouth.    . Dapsone 5 % topical gel     . diclofenac (VOLTAREN) 75 MG EC tablet Take 1 tablet (75 mg total) by mouth 2 (two) times daily. 60 tablet 1  . minocycline (MINOCIN,DYNACIN) 100 MG capsule TK 1 C PO D    . PREVIDENT 5000 SENSITIVE 1.1-5 % PSTE BRUSH ONTO THE TEETH IN THE MORNING AND AT BEDTIME    . spironolactone (ALDACTONE) 50 MG tablet TK 1 T PO D  2  . SULFACLEANSE 8/4 8-4 % SUSP      No current facility-administered medications on file prior to visit.     Past Surgical History:  Procedure Laterality Date  . CESAREAN SECTION    . VARICOSE VEIN SURGERY      Allergies  Allergen Reactions  .  Codeine     migraines    Social History   Socioeconomic History  . Marital status: Widowed    Spouse name: Not on file  . Number of children: Not on file  . Years of education: Not on file  . Highest education level: Not on file  Occupational History  . Not on file  Social Needs  . Financial resource strain: Not on file  . Food insecurity:    Worry: Not on file    Inability: Not on file  . Transportation needs:    Medical: Not on file    Non-medical: Not on file  Tobacco Use  . Smoking status: Never Smoker  . Smokeless tobacco: Never Used  Substance and Sexual Activity  . Alcohol use: Yes  . Drug use: No  . Sexual activity: Not on file  Lifestyle  . Physical activity:    Days per week: Not on file    Minutes per session: Not on file  . Stress: Not on file  Relationships  . Social connections:    Talks on phone: Not on file    Gets together: Not on file    Attends religious service: Not on file    Active member of club or organization: Not on file    Attends meetings of clubs or organizations: Not on file    Relationship status: Not on file  .  Intimate partner violence:    Fear of current or ex partner: Not on file    Emotionally abused: Not on file    Physically abused: Not on file    Forced sexual activity: Not on file  Other Topics Concern  . Not on file  Social History Narrative  . Not on file    History reviewed. No pertinent family history.  BP 127/86   Pulse 68   Ht 5\' 1"  (1.549 m)   Wt 180 lb (81.6 kg)   BMI 34.01 kg/m   Review of Systems: See HPI above.     Objective:  Physical Exam:  Gen: NAD, comfortable in exam room  Rest of knee exam not repeated today.  Left knee: Mild effusion.  No other gross deformity, ecchymoses. TTP medial joint line, less post patellar facets. FROM with 5/5 strength flexion and extension. Negative ant/post drawers. Negative valgus/varus testing. Negative lachmans. Negative mcmurrays, apleys, patellar  apprehension. NV intact distally.   Assessment & Plan:  1. Left knee pain - Started viscosupplementation today.  No improvement with arthritis despite PT, steroid injections, diclofenac, compression sleeve.  F/u in 1 week for second injection.  Previously discussed tylenol, diclofenac, icing, topical medications.  After informed written consent timeout was performed, patient was lying supine on exam table. Left knee was prepped with alcohol swab and utilizing superolateral approach with ultrasound guidance, patient's left knee was injected intraarticularly with 3mL bupivicaine followed by gelsyn. Patient tolerated the procedure well without immediate complications.

## 2018-09-18 ENCOUNTER — Ambulatory Visit: Payer: BLUE CROSS/BLUE SHIELD | Admitting: Family Medicine

## 2018-09-18 ENCOUNTER — Other Ambulatory Visit: Payer: Self-pay

## 2018-09-18 ENCOUNTER — Encounter: Payer: Self-pay | Admitting: Family Medicine

## 2018-09-18 DIAGNOSIS — M1712 Unilateral primary osteoarthritis, left knee: Secondary | ICD-10-CM | POA: Diagnosis not present

## 2018-09-18 NOTE — Progress Notes (Signed)
PCP: Patient, No Pcp Per  Subjective:   HPI: Patient is a 53 y.o. female here for left knee pain.  01/01/18: Patient reports for about 2 weeks she's had fairly severe medial left knee pain. Pain level 10/10 and sharp. Difficulty sleeping at night due to pain. Feels like going to give out as well. Tried topical muscle rub, biofreeze, heat that helped only temporarily. No skin changes, numbness.  07/25/18: Patient reports her pain in left knee has worsened over past 2 weeks. Associated swelling, anterior pain radiating to back of knee. Feels a cramping behind knee. Pain up to 10/10 at worst. Difficulty sleeping as a result. Taking diclofenac. No numbness, skin changes.  4/29: Patient reports only mild improvement since last visit with injection. Pain level is 4/10 medial left knee up to 10/10 and sharp at nighttime especially. Using thermogesic patches. No skin changes. No new injuries.  5/6: Patient returns to start viscosupplementation. No skin changes. No new complaints. Left knee pain 5/10.  5/13: Patient returns for second gelsyn injection. No changes since last visit. No skin changes.  History reviewed. No pertinent past medical history.  Current Outpatient Medications on File Prior to Visit  Medication Sig Dispense Refill  . amLODipine (NORVASC) 5 MG tablet Take 1 tablet (5 mg total) by mouth daily. 30 tablet 1  . aspirin EC 81 MG tablet Take 81 mg by mouth.    . Dapsone 5 % topical gel     . diclofenac (VOLTAREN) 75 MG EC tablet Take 1 tablet (75 mg total) by mouth 2 (two) times daily. 60 tablet 1  . minocycline (MINOCIN,DYNACIN) 100 MG capsule TK 1 C PO D    . PREVIDENT 5000 SENSITIVE 1.1-5 % PSTE BRUSH ONTO THE TEETH IN THE MORNING AND AT BEDTIME    . spironolactone (ALDACTONE) 50 MG tablet TK 1 T PO D  2  . SULFACLEANSE 8/4 8-4 % SUSP      No current facility-administered medications on file prior to visit.     Past Surgical History:  Procedure  Laterality Date  . CESAREAN SECTION    . VARICOSE VEIN SURGERY      Allergies  Allergen Reactions  . Codeine     migraines    Social History   Socioeconomic History  . Marital status: Widowed    Spouse name: Not on file  . Number of children: Not on file  . Years of education: Not on file  . Highest education level: Not on file  Occupational History  . Not on file  Social Needs  . Financial resource strain: Not on file  . Food insecurity:    Worry: Not on file    Inability: Not on file  . Transportation needs:    Medical: Not on file    Non-medical: Not on file  Tobacco Use  . Smoking status: Never Smoker  . Smokeless tobacco: Never Used  Substance and Sexual Activity  . Alcohol use: Yes  . Drug use: No  . Sexual activity: Not on file  Lifestyle  . Physical activity:    Days per week: Not on file    Minutes per session: Not on file  . Stress: Not on file  Relationships  . Social connections:    Talks on phone: Not on file    Gets together: Not on file    Attends religious service: Not on file    Active member of club or organization: Not on file    Attends meetings  of clubs or organizations: Not on file    Relationship status: Not on file  . Intimate partner violence:    Fear of current or ex partner: Not on file    Emotionally abused: Not on file    Physically abused: Not on file    Forced sexual activity: Not on file  Other Topics Concern  . Not on file  Social History Narrative  . Not on file    History reviewed. No pertinent family history.  BP 118/81   Pulse 71   Ht 5\' 1"  (1.549 m)   Wt 180 lb (81.6 kg)   BMI 34.01 kg/m   Review of Systems: See HPI above.     Objective:  Physical Exam:  Gen: NAD, comfortable in exam room Rest of exam not repeated today.  Left knee: Mild effusion.  No other gross deformity, ecchymoses. TTP medial joint line, less post patellar facets. FROM with 5/5 strength flexion and extension. Negative ant/post  drawers. Negative valgus/varus testing. Negative lachmans. Negative mcmurrays, apleys, patellar apprehension. NV intact distally.   Assessment & Plan:  1. Left knee pain - second gelsyn injection given today.  Previously tried PT, steroid injections, diclofenac, compression sleeve.  F/u in 1 week for third injection.  Previously discussed tylenol, diclofenac, icing, topical medications.  After informed written consent timeout was performed, patient was lying supine on exam table. Left knee was prepped with alcohol swab and utilizing superolateral approach with ultrasound guidance, patient's left knee was injected intraarticularly with 58mL bupivicaine followed by gelsyn. Patient tolerated the procedure well without immediate complications.

## 2018-09-25 ENCOUNTER — Other Ambulatory Visit: Payer: Self-pay

## 2018-09-25 ENCOUNTER — Encounter: Payer: Self-pay | Admitting: Family Medicine

## 2018-09-25 ENCOUNTER — Ambulatory Visit: Payer: BLUE CROSS/BLUE SHIELD | Admitting: Family Medicine

## 2018-09-25 DIAGNOSIS — M1712 Unilateral primary osteoarthritis, left knee: Secondary | ICD-10-CM | POA: Diagnosis not present

## 2018-09-25 NOTE — Progress Notes (Signed)
PCP: Patient, No Pcp Per  Subjective:   HPI: Patient is a 53 y.o. female here for left knee pain.  01/01/18: Patient reports for about 2 weeks she's had fairly severe medial left knee pain. Pain level 10/10 and sharp. Difficulty sleeping at night due to pain. Feels like going to give out as well. Tried topical muscle rub, biofreeze, heat that helped only temporarily. No skin changes, numbness.  07/25/18: Patient reports her pain in left knee has worsened over past 2 weeks. Associated swelling, anterior pain radiating to back of knee. Feels a cramping behind knee. Pain up to 10/10 at worst. Difficulty sleeping as a result. Taking diclofenac. No numbness, skin changes.  4/29: Patient reports only mild improvement since last visit with injection. Pain level is 4/10 medial left knee up to 10/10 and sharp at nighttime especially. Using thermogesic patches. No skin changes. No new injuries.  5/6: Patient returns to start viscosupplementation. No skin changes. No new complaints. Left knee pain 5/10.  5/13: Patient returns for second gelsyn injection. No changes since last visit. No skin changes.  5/20: Patient returns for third gelsyn injection. Feels mild improvement compared to last visit. No skin changes.  History reviewed. No pertinent past medical history.  Current Outpatient Medications on File Prior to Visit  Medication Sig Dispense Refill  . amLODipine (NORVASC) 5 MG tablet Take 1 tablet (5 mg total) by mouth daily. 30 tablet 1  . aspirin EC 81 MG tablet Take 81 mg by mouth.    . Dapsone 5 % topical gel     . diclofenac (VOLTAREN) 75 MG EC tablet Take 1 tablet (75 mg total) by mouth 2 (two) times daily. 60 tablet 1  . minocycline (MINOCIN,DYNACIN) 100 MG capsule TK 1 C PO D    . PREVIDENT 5000 SENSITIVE 1.1-5 % PSTE BRUSH ONTO THE TEETH IN THE MORNING AND AT BEDTIME    . spironolactone (ALDACTONE) 50 MG tablet TK 1 T PO D  2  . SULFACLEANSE 8/4 8-4 % SUSP       No current facility-administered medications on file prior to visit.     Past Surgical History:  Procedure Laterality Date  . CESAREAN SECTION    . VARICOSE VEIN SURGERY      Allergies  Allergen Reactions  . Codeine     migraines    Social History   Socioeconomic History  . Marital status: Widowed    Spouse name: Not on file  . Number of children: Not on file  . Years of education: Not on file  . Highest education level: Not on file  Occupational History  . Not on file  Social Needs  . Financial resource strain: Not on file  . Food insecurity:    Worry: Not on file    Inability: Not on file  . Transportation needs:    Medical: Not on file    Non-medical: Not on file  Tobacco Use  . Smoking status: Never Smoker  . Smokeless tobacco: Never Used  Substance and Sexual Activity  . Alcohol use: Yes  . Drug use: No  . Sexual activity: Not on file  Lifestyle  . Physical activity:    Days per week: Not on file    Minutes per session: Not on file  . Stress: Not on file  Relationships  . Social connections:    Talks on phone: Not on file    Gets together: Not on file    Attends religious service: Not on  file    Active member of club or organization: Not on file    Attends meetings of clubs or organizations: Not on file    Relationship status: Not on file  . Intimate partner violence:    Fear of current or ex partner: Not on file    Emotionally abused: Not on file    Physically abused: Not on file    Forced sexual activity: Not on file  Other Topics Concern  . Not on file  Social History Narrative  . Not on file    History reviewed. No pertinent family history.  BP 111/75   Pulse 64   Ht 5\' 1"  (1.549 m)   Wt 180 lb (81.6 kg)   BMI 34.01 kg/m   Review of Systems: See HPI above.     Objective:  Physical Exam:  Gen: NAD, comfortable in exam room Rest of exam not repeated today.  Left knee: Mild effusion.  No other gross deformity,  ecchymoses. TTP medial joint line, less post patellar facets. FROM with 5/5 strength flexion and extension. Negative ant/post drawers. Negative valgus/varus testing. Negative lachmans. Negative mcmurrays, apleys, patellar apprehension. NV intact distally.   Assessment & Plan:  1. Left knee pain - third gelsyn injection given today.  Previously tried PT, steroid injections, diclofenac, compression sleeve.  Let us know how she's doing in 4 weeks - consider fourth injection if still having pain.  After informed written consent timeout was performed, patient was lying supine on exam table. Left knee was prepped with alcohol swab and utilizing superolateral approach with ultrasound guidance, patient's left knee was injected intraarticularly with 31mL bupivicaine followed by gelsyn. Patient tolerated the procedure well without immediate complications.

## 2018-10-16 DIAGNOSIS — R079 Chest pain, unspecified: Secondary | ICD-10-CM | POA: Diagnosis not present

## 2018-10-16 DIAGNOSIS — R072 Precordial pain: Secondary | ICD-10-CM | POA: Diagnosis not present

## 2018-10-17 DIAGNOSIS — R001 Bradycardia, unspecified: Secondary | ICD-10-CM | POA: Diagnosis not present

## 2018-12-05 ENCOUNTER — Encounter (HOSPITAL_BASED_OUTPATIENT_CLINIC_OR_DEPARTMENT_OTHER): Payer: Self-pay

## 2018-12-05 ENCOUNTER — Other Ambulatory Visit: Payer: Self-pay

## 2018-12-05 ENCOUNTER — Emergency Department (HOSPITAL_BASED_OUTPATIENT_CLINIC_OR_DEPARTMENT_OTHER)
Admission: EM | Admit: 2018-12-05 | Discharge: 2018-12-05 | Disposition: A | Discharge status: Home / Self Care | Payer: BC Managed Care – PPO | Attending: Emergency Medicine | Admitting: Emergency Medicine

## 2018-12-05 DIAGNOSIS — Z79899 Other long term (current) drug therapy: Secondary | ICD-10-CM | POA: Diagnosis not present

## 2018-12-05 DIAGNOSIS — R42 Dizziness and giddiness: Secondary | ICD-10-CM | POA: Diagnosis not present

## 2018-12-05 DIAGNOSIS — Z87891 Personal history of nicotine dependence: Secondary | ICD-10-CM | POA: Insufficient documentation

## 2018-12-05 DIAGNOSIS — R001 Bradycardia, unspecified: Secondary | ICD-10-CM | POA: Diagnosis not present

## 2018-12-05 LAB — CK: Total CK: 181 U/L (ref 38–234)

## 2018-12-05 LAB — CBC WITH DIFFERENTIAL/PLATELET
Abs Immature Granulocytes: 0.01 10*3/uL (ref 0.00–0.07)
Basophils Absolute: 0 10*3/uL (ref 0.0–0.1)
Basophils Relative: 1 %
Eosinophils Absolute: 0.2 10*3/uL (ref 0.0–0.5)
Eosinophils Relative: 3 %
HCT: 37.7 % (ref 36.0–46.0)
Hemoglobin: 11.7 g/dL — ABNORMAL LOW (ref 12.0–15.0)
Immature Granulocytes: 0 %
Lymphocytes Relative: 39 %
Lymphs Abs: 1.9 10*3/uL (ref 0.7–4.0)
MCH: 27.3 pg (ref 26.0–34.0)
MCHC: 31 g/dL (ref 30.0–36.0)
MCV: 87.9 fL (ref 80.0–100.0)
Monocytes Absolute: 0.5 10*3/uL (ref 0.1–1.0)
Monocytes Relative: 11 %
Neutro Abs: 2.2 10*3/uL (ref 1.7–7.7)
Neutrophils Relative %: 46 %
Platelets: 213 10*3/uL (ref 150–400)
RBC: 4.29 MIL/uL (ref 3.87–5.11)
RDW: 13.2 % (ref 11.5–15.5)
WBC: 4.8 10*3/uL (ref 4.0–10.5)
nRBC: 0 % (ref 0.0–0.2)

## 2018-12-05 LAB — COMPREHENSIVE METABOLIC PANEL
ALT: 15 U/L (ref 0–44)
AST: 18 U/L (ref 15–41)
Albumin: 3.8 g/dL (ref 3.5–5.0)
Alkaline Phosphatase: 73 U/L (ref 38–126)
Anion gap: 11 (ref 5–15)
BUN: 11 mg/dL (ref 6–20)
CO2: 25 mmol/L (ref 22–32)
Calcium: 8.9 mg/dL (ref 8.9–10.3)
Chloride: 102 mmol/L (ref 98–111)
Creatinine, Ser: 0.6 mg/dL (ref 0.44–1.00)
GFR calc Af Amer: >60 mL/min (ref >60)
GFR calc non Af Amer: >60 mL/min (ref >60)
Glucose, Bld: 96 mg/dL (ref 70–99)
Potassium: 3.7 mmol/L (ref 3.5–5.1)
Sodium: 138 mmol/L (ref 135–145)
Total Bilirubin: 0.3 mg/dL (ref 0.3–1.2)
Total Protein: 7.1 g/dL (ref 6.5–8.1)

## 2018-12-05 LAB — MAGNESIUM: Magnesium: 2 mg/dL (ref 1.7–2.4)

## 2018-12-05 MED ORDER — SODIUM CHLORIDE 0.9 % IV BOLUS
1000.0000 mL | Freq: Once | INTRAVENOUS | Status: AC
  Administered 2018-12-05: 1000 mL via INTRAVENOUS

## 2018-12-05 NOTE — ED Notes (Signed)
MD at bedside. 

## 2018-12-05 NOTE — ED Provider Notes (Signed)
[facility] EMERGENCY DEPARTMENT Provider Note   CSN: [id number] Arrival date & time: 12/05/18  0175    History   Chief Complaint Chief Complaint  Patient presents with  . Dizziness    HPI Tanya Kelly is a 53 y.o. female.     HPI  53 year old female presents with lightheadedness.  Felt lightheaded at work and saw the nurse who said her blood pressure was low in the low 100s and her glucose was high at 152.  Patient had not yet taken her blood pressure medicine this morning.  The lightheadedness has improved since leaving work.  She has noticed that it works she has been feeling hot.  She is also been having cramps in her bilateral calves for the last couple days and some in her right arm.  No swelling in her extremities.  No chest pain or headache.  No fevers.  History reviewed. No pertinent past medical history.  Patient Active Problem List   Diagnosis Date Noted  . Right knee pain 06/15/2017  . Onychomycosis due to dermatophyte 04/09/2017  . Tinea pedis of both feet 04/09/2017  . 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 Fuchs' corneal dystrophy 12/27/2015  . Leg pain 12/27/2015  . Macular retinal cyst of left eye 12/27/2015  . Migraine headache 12/27/2015  . Nuclear sclerotic cataract of both eyes 12/27/2015    Past Surgical History:  Procedure Laterality Date  . CESAREAN SECTION    . VARICOSE VEIN SURGERY       OB History   No obstetric history on file.      Home Medications    Prior to Admission medications   Medication Sig Start Date End Date Taking? Authorizing Provider  amLODipine (NORVASC) 5 MG tablet Take 1 tablet (5 mg total) by mouth daily. 08/20/18  Yes Mesner, Corene Cornea, MD  aspirin EC 81 MG tablet Take 81 mg by mouth.    [provider]  Dapsone 5 % topical gel  06/08/18   [provider]  diclofenac (VOLTAREN) 75 MG EC tablet Take 1 tablet (75 mg total) by mouth 2 (two) times daily. 06/19/18   Hudnall, Sharyn Lull, MD   minocycline (MINOCIN,DYNACIN) 100 MG capsule TK 1 C PO D 07/22/18   [provider]  PREVIDENT 5000 SENSITIVE 1.1-5 % PSTE BRUSH ONTO THE TEETH IN THE MORNING AND AT BEDTIME 08/10/18   [provider]  spironolactone (ALDACTONE) 50 MG tablet TK 1 T PO D 02/27/18   [provider]  SULFACLEANSE 8/4 8-4 % SUSP  07/01/18   [provider]    Family History History reviewed. No pertinent family history.  Social History Social History   Tobacco Use  . Smoking status: Never Smoker  . Smokeless tobacco: Never Used  Substance Use Topics  . Alcohol use: Yes  . Drug use: No     Allergies   Codeine   Review of Systems Review of Systems  Constitutional: Negative for fever.  Respiratory: Negative for shortness of breath.   Cardiovascular: Negative for chest pain.  Gastrointestinal: Negative for abdominal pain, diarrhea and vomiting.  Musculoskeletal: Positive for myalgias.  Neurological: Positive for light-headedness.  All other systems reviewed and are negative.    Physical Exam Updated Vital Signs BP (!) 147/89   Pulse (!) 54   Temp 97.8 F (36.6 C) (Oral)   Resp 16   Ht 5\' 1"  (1.549 m)   Wt 81.6 kg   SpO2 100%   BMI 34.01 kg/m  Physical Exam Vitals signs and nursing note reviewed.  Constitutional:      General: She is not in acute distress.    Appearance: She is well-developed. She is not ill-appearing or diaphoretic.  HENT:     Head: Normocephalic and atraumatic.     Right Ear: External ear normal.     Left Ear: External ear normal.     Nose: Nose normal.  Eyes:     General:        Right eye: No discharge.        Left eye: No discharge.     Extraocular Movements: Extraocular movements intact.     Pupils: Pupils are equal, round, and reactive to light.  Cardiovascular:     Rate and Rhythm: Regular rhythm. Bradycardia present.     Heart sounds: Normal heart sounds. No murmur.     Comments: HR 50s Pulmonary:     Effort:  Pulmonary effort is normal.     Breath sounds: Normal breath sounds.  Abdominal:     Palpations: Abdomen is soft.     Tenderness: There is no abdominal tenderness.  Musculoskeletal:     Right lower leg: No edema.     Left lower leg: No edema.     Comments: No muscular tenderness  Skin:    General: Skin is warm and dry.  Neurological:     Mental Status: She is alert.     Comments: CN 3-12 grossly intact. 5/5 strength in all 4 extremities. Grossly normal sensation. Normal finger to nose.   Psychiatric:        Mood and Affect: Mood is not anxious.      ED Treatments / Results  Labs (all labs ordered are listed, but only abnormal results are displayed) Labs Reviewed  CBC WITH DIFFERENTIAL/PLATELET - Abnormal; Notable for the following components:      Result Value   Hemoglobin 11.7 (*)    All other components within normal limits  COMPREHENSIVE METABOLIC PANEL  MAGNESIUM  CK    EKG EKG Interpretation  Date/Time:  Thursday December 05 2018 08:28:25 EDT Ventricular Rate:  49 PR Interval:    QRS Duration: 79 QT Interval:  431 QTC Calculation: 389 R Axis:   48 Text Interpretation:  Sinus bradycardia Consider left atrial enlargement no significant change since April 2020 Confirmed by Pricilla LovelessGoldston, Ericah Scotto [phone]([id number]) on 12/05/2018 8:41:10 AM   Radiology No results found.  Procedures Procedures (including critical care time)  Medications Ordered in ED Medications  sodium chloride 0.9 % bolus 1,000 mL (0 mLs Intravenous Stopped 12/05/18 0934)     Initial Impression / Assessment and Plan / ED Course  I have reviewed the triage vital signs and the nursing notes.  Pertinent labs & imaging results that were available during my care of the patient were reviewed by me and considered in my medical decision making (see chart for details).        Patient's dizziness is probably lightheadedness, highly doubt vertigo or stroke.  Likely from dehydration/heat rather than a cardiac cause.   Overall appears well.  Neuro exam is benign.  Mild bradycardia but this does not appear pathologic.  Given IV fluids and labs are reassuring.  Discharge home with return precautions.  Final Clinical Impressions(s) / ED Diagnoses   Final diagnoses:  Dizziness    ED Discharge Orders    None       Pricilla LovelessGoldston, Berenise Hunton, MD 12/05/18 [phone]

## 2018-12-05 NOTE — ED Notes (Signed)
Pt assisted to restroom.  

## 2018-12-05 NOTE — ED Notes (Signed)
Pt states feeling  Better now, was instructed by work nurse to be evaluated for low b/p and elevated blood sugar, no previous dx diabetes.  Pt does report hx HTN has been having trouble getting in to see PMD due to covid restrictions.  States taking amlodipine for HTN consistently.

## 2018-12-05 NOTE — ED Triage Notes (Addendum)
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  Went to facility nurse, found b/p 107/80, CBG 152.  Pt reports leg cramping over past few days.  Denies fevers, denies any known exposure to illness.

## 2018-12-21 DIAGNOSIS — R079 Chest pain, unspecified: Secondary | ICD-10-CM | POA: Diagnosis not present

## 2018-12-21 DIAGNOSIS — R072 Precordial pain: Secondary | ICD-10-CM | POA: Diagnosis not present

## 2018-12-21 DIAGNOSIS — R001 Bradycardia, unspecified: Secondary | ICD-10-CM | POA: Diagnosis not present

## 2018-12-24 DIAGNOSIS — R001 Bradycardia, unspecified: Secondary | ICD-10-CM | POA: Diagnosis not present

## 2018-12-24 DIAGNOSIS — R0789 Other chest pain: Secondary | ICD-10-CM | POA: Diagnosis not present

## 2018-12-27 ENCOUNTER — Other Ambulatory Visit: Payer: Self-pay

## 2018-12-27 DIAGNOSIS — R6889 Other general symptoms and signs: Secondary | ICD-10-CM | POA: Diagnosis not present

## 2018-12-27 DIAGNOSIS — Z20822 Contact with and (suspected) exposure to covid-19: Secondary | ICD-10-CM

## 2018-12-28 LAB — NOVEL CORONAVIRUS, NAA: SARS-CoV-2, NAA: NOT DETECTED

## 2019-02-04 DIAGNOSIS — R531 Weakness: Secondary | ICD-10-CM | POA: Diagnosis not present

## 2019-02-04 DIAGNOSIS — R29810 Facial weakness: Secondary | ICD-10-CM | POA: Diagnosis not present

## 2019-02-04 DIAGNOSIS — I10 Essential (primary) hypertension: Secondary | ICD-10-CM | POA: Diagnosis not present

## 2019-02-04 DIAGNOSIS — R457 State of emotional shock and stress, unspecified: Secondary | ICD-10-CM | POA: Diagnosis not present

## 2019-02-23 DIAGNOSIS — R079 Chest pain, unspecified: Secondary | ICD-10-CM | POA: Diagnosis not present

## 2019-02-23 DIAGNOSIS — R0602 Shortness of breath: Secondary | ICD-10-CM | POA: Diagnosis not present

## 2019-02-23 DIAGNOSIS — R072 Precordial pain: Secondary | ICD-10-CM | POA: Diagnosis not present

## 2019-03-20 DIAGNOSIS — D649 Anemia, unspecified: Secondary | ICD-10-CM | POA: Diagnosis not present

## 2019-03-20 DIAGNOSIS — I6523 Occlusion and stenosis of bilateral carotid arteries: Secondary | ICD-10-CM | POA: Diagnosis not present

## 2019-03-20 DIAGNOSIS — I10 Essential (primary) hypertension: Secondary | ICD-10-CM | POA: Diagnosis not present

## 2019-03-20 DIAGNOSIS — I119 Hypertensive heart disease without heart failure: Secondary | ICD-10-CM | POA: Diagnosis not present

## 2019-03-20 DIAGNOSIS — R531 Weakness: Secondary | ICD-10-CM | POA: Diagnosis not present

## 2019-03-20 DIAGNOSIS — R519 Headache, unspecified: Secondary | ICD-10-CM | POA: Diagnosis not present

## 2019-03-20 DIAGNOSIS — R001 Bradycardia, unspecified: Secondary | ICD-10-CM | POA: Diagnosis not present

## 2019-03-20 DIAGNOSIS — G459 Transient cerebral ischemic attack, unspecified: Secondary | ICD-10-CM | POA: Diagnosis not present

## 2019-03-20 DIAGNOSIS — R200 Anesthesia of skin: Secondary | ICD-10-CM | POA: Diagnosis not present

## 2019-03-20 DIAGNOSIS — Z79899 Other long term (current) drug therapy: Secondary | ICD-10-CM | POA: Diagnosis not present

## 2019-03-21 DIAGNOSIS — R531 Weakness: Secondary | ICD-10-CM | POA: Diagnosis not present

## 2019-03-21 DIAGNOSIS — I10 Essential (primary) hypertension: Secondary | ICD-10-CM | POA: Diagnosis not present

## 2019-03-21 DIAGNOSIS — R200 Anesthesia of skin: Secondary | ICD-10-CM | POA: Diagnosis not present

## 2019-03-21 DIAGNOSIS — G459 Transient cerebral ischemic attack, unspecified: Secondary | ICD-10-CM | POA: Diagnosis not present

## 2019-03-23 DIAGNOSIS — R9431 Abnormal electrocardiogram [ECG] [EKG]: Secondary | ICD-10-CM | POA: Diagnosis not present

## 2019-03-23 DIAGNOSIS — R079 Chest pain, unspecified: Secondary | ICD-10-CM | POA: Diagnosis not present

## 2019-03-23 DIAGNOSIS — R0789 Other chest pain: Secondary | ICD-10-CM | POA: Diagnosis not present

## 2019-03-23 DIAGNOSIS — R001 Bradycardia, unspecified: Secondary | ICD-10-CM | POA: Diagnosis not present

## 2019-03-25 DIAGNOSIS — M2550 Pain in unspecified joint: Secondary | ICD-10-CM | POA: Diagnosis not present

## 2019-03-25 DIAGNOSIS — Y999 Unspecified external cause status: Secondary | ICD-10-CM | POA: Diagnosis not present

## 2019-03-25 DIAGNOSIS — M7918 Myalgia, other site: Secondary | ICD-10-CM | POA: Diagnosis not present

## 2019-03-25 DIAGNOSIS — X58XXXA Exposure to other specified factors, initial encounter: Secondary | ICD-10-CM | POA: Diagnosis not present

## 2019-03-25 DIAGNOSIS — R079 Chest pain, unspecified: Secondary | ICD-10-CM | POA: Diagnosis not present

## 2019-03-25 DIAGNOSIS — Z79899 Other long term (current) drug therapy: Secondary | ICD-10-CM | POA: Diagnosis not present

## 2019-03-25 DIAGNOSIS — M609 Myositis, unspecified: Secondary | ICD-10-CM | POA: Diagnosis not present

## 2019-03-25 DIAGNOSIS — R635 Abnormal weight gain: Secondary | ICD-10-CM | POA: Diagnosis not present

## 2019-03-25 DIAGNOSIS — R202 Paresthesia of skin: Secondary | ICD-10-CM | POA: Diagnosis not present

## 2019-03-25 DIAGNOSIS — I10 Essential (primary) hypertension: Secondary | ICD-10-CM | POA: Diagnosis not present

## 2019-03-25 DIAGNOSIS — T466X5A Adverse effect of antihyperlipidemic and antiarteriosclerotic drugs, initial encounter: Secondary | ICD-10-CM | POA: Diagnosis not present

## 2019-03-25 DIAGNOSIS — R531 Weakness: Secondary | ICD-10-CM | POA: Diagnosis not present

## 2019-03-25 DIAGNOSIS — T50905A Adverse effect of unspecified drugs, medicaments and biological substances, initial encounter: Secondary | ICD-10-CM | POA: Diagnosis not present

## 2019-03-26 DIAGNOSIS — R079 Chest pain, unspecified: Secondary | ICD-10-CM | POA: Diagnosis not present

## 2019-03-26 DIAGNOSIS — T50905A Adverse effect of unspecified drugs, medicaments and biological substances, initial encounter: Secondary | ICD-10-CM | POA: Diagnosis not present

## 2019-03-26 DIAGNOSIS — M7918 Myalgia, other site: Secondary | ICD-10-CM | POA: Diagnosis not present

## 2019-04-17 DIAGNOSIS — M609 Myositis, unspecified: Secondary | ICD-10-CM | POA: Diagnosis not present

## 2019-04-17 DIAGNOSIS — T466X5A Adverse effect of antihyperlipidemic and antiarteriosclerotic drugs, initial encounter: Secondary | ICD-10-CM | POA: Diagnosis not present

## 2019-05-09 DIAGNOSIS — J189 Pneumonia, unspecified organism: Secondary | ICD-10-CM | POA: Diagnosis not present

## 2019-05-09 DIAGNOSIS — R079 Chest pain, unspecified: Secondary | ICD-10-CM | POA: Diagnosis not present

## 2019-05-09 DIAGNOSIS — J181 Lobar pneumonia, unspecified organism: Secondary | ICD-10-CM | POA: Diagnosis not present

## 2019-05-09 DIAGNOSIS — E876 Hypokalemia: Secondary | ICD-10-CM | POA: Diagnosis not present

## 2019-05-09 DIAGNOSIS — Z20828 Contact with and (suspected) exposure to other viral communicable diseases: Secondary | ICD-10-CM | POA: Diagnosis not present

## 2019-05-09 DIAGNOSIS — R072 Precordial pain: Secondary | ICD-10-CM | POA: Diagnosis not present

## 2019-05-09 DIAGNOSIS — R918 Other nonspecific abnormal finding of lung field: Secondary | ICD-10-CM | POA: Diagnosis not present

## 2019-05-09 DIAGNOSIS — Z20822 Contact with and (suspected) exposure to covid-19: Secondary | ICD-10-CM | POA: Diagnosis not present

## 2019-05-09 DIAGNOSIS — R519 Headache, unspecified: Secondary | ICD-10-CM | POA: Diagnosis not present

## 2019-05-09 DIAGNOSIS — R05 Cough: Secondary | ICD-10-CM | POA: Diagnosis not present

## 2019-05-21 DIAGNOSIS — E876 Hypokalemia: Secondary | ICD-10-CM | POA: Diagnosis not present

## 2019-05-21 DIAGNOSIS — J189 Pneumonia, unspecified organism: Secondary | ICD-10-CM | POA: Diagnosis not present

## 2019-05-21 DIAGNOSIS — D72819 Decreased white blood cell count, unspecified: Secondary | ICD-10-CM | POA: Diagnosis not present

## 2019-05-29 DIAGNOSIS — R0602 Shortness of breath: Secondary | ICD-10-CM | POA: Diagnosis not present

## 2019-05-29 DIAGNOSIS — R079 Chest pain, unspecified: Secondary | ICD-10-CM | POA: Diagnosis not present

## 2019-05-29 DIAGNOSIS — R0781 Pleurodynia: Secondary | ICD-10-CM | POA: Diagnosis not present

## 2019-05-29 DIAGNOSIS — M79604 Pain in right leg: Secondary | ICD-10-CM | POA: Diagnosis not present

## 2019-05-29 DIAGNOSIS — R0789 Other chest pain: Secondary | ICD-10-CM | POA: Diagnosis not present

## 2019-05-29 DIAGNOSIS — Z3202 Encounter for pregnancy test, result negative: Secondary | ICD-10-CM | POA: Diagnosis not present

## 2019-06-24 DIAGNOSIS — R079 Chest pain, unspecified: Secondary | ICD-10-CM | POA: Diagnosis not present

## 2019-06-24 DIAGNOSIS — R600 Localized edema: Secondary | ICD-10-CM | POA: Diagnosis not present

## 2019-07-07 DIAGNOSIS — R9431 Abnormal electrocardiogram [ECG] [EKG]: Secondary | ICD-10-CM | POA: Diagnosis not present

## 2019-07-07 DIAGNOSIS — R001 Bradycardia, unspecified: Secondary | ICD-10-CM | POA: Diagnosis not present

## 2019-07-07 DIAGNOSIS — S9001XA Contusion of right ankle, initial encounter: Secondary | ICD-10-CM | POA: Diagnosis not present

## 2019-07-07 DIAGNOSIS — Z79899 Other long term (current) drug therapy: Secondary | ICD-10-CM | POA: Diagnosis not present

## 2019-07-07 DIAGNOSIS — Z8673 Personal history of transient ischemic attack (TIA), and cerebral infarction without residual deficits: Secondary | ICD-10-CM | POA: Diagnosis not present

## 2019-07-07 DIAGNOSIS — I10 Essential (primary) hypertension: Secondary | ICD-10-CM | POA: Diagnosis not present

## 2019-07-07 DIAGNOSIS — I4891 Unspecified atrial fibrillation: Secondary | ICD-10-CM | POA: Diagnosis not present

## 2019-07-07 DIAGNOSIS — R0789 Other chest pain: Secondary | ICD-10-CM | POA: Diagnosis not present

## 2019-07-07 DIAGNOSIS — R0602 Shortness of breath: Secondary | ICD-10-CM | POA: Diagnosis not present

## 2019-07-07 DIAGNOSIS — I454 Nonspecific intraventricular block: Secondary | ICD-10-CM | POA: Diagnosis not present

## 2019-07-07 DIAGNOSIS — Z7982 Long term (current) use of aspirin: Secondary | ICD-10-CM | POA: Diagnosis not present

## 2019-07-10 DIAGNOSIS — E782 Mixed hyperlipidemia: Secondary | ICD-10-CM | POA: Diagnosis not present

## 2019-07-10 DIAGNOSIS — R079 Chest pain, unspecified: Secondary | ICD-10-CM | POA: Diagnosis not present

## 2019-07-10 DIAGNOSIS — I480 Paroxysmal atrial fibrillation: Secondary | ICD-10-CM | POA: Diagnosis not present

## 2019-07-10 DIAGNOSIS — I10 Essential (primary) hypertension: Secondary | ICD-10-CM | POA: Diagnosis not present

## 2019-08-06 DIAGNOSIS — G4700 Insomnia, unspecified: Secondary | ICD-10-CM | POA: Diagnosis not present

## 2019-08-06 DIAGNOSIS — I480 Paroxysmal atrial fibrillation: Secondary | ICD-10-CM | POA: Diagnosis not present

## 2019-08-06 DIAGNOSIS — R0683 Snoring: Secondary | ICD-10-CM | POA: Diagnosis not present

## 2019-08-06 DIAGNOSIS — E669 Obesity, unspecified: Secondary | ICD-10-CM | POA: Diagnosis not present

## 2019-08-11 DIAGNOSIS — R0789 Other chest pain: Secondary | ICD-10-CM | POA: Diagnosis not present

## 2019-08-21 DIAGNOSIS — H35372 Puckering of macula, left eye: Secondary | ICD-10-CM | POA: Diagnosis not present

## 2019-08-28 DIAGNOSIS — I471 Supraventricular tachycardia: Secondary | ICD-10-CM | POA: Diagnosis not present

## 2019-08-28 DIAGNOSIS — Z23 Encounter for immunization: Secondary | ICD-10-CM | POA: Diagnosis not present

## 2019-08-29 DIAGNOSIS — H40033 Anatomical narrow angle, bilateral: Secondary | ICD-10-CM | POA: Diagnosis not present

## 2019-09-18 DIAGNOSIS — Z23 Encounter for immunization: Secondary | ICD-10-CM | POA: Diagnosis not present

## 2019-09-21 IMAGING — CR DG CHEST 2V
2 series · 2 of 2 positions shown · non-contrast
Comparison: Chest x-ray dated June 01, 2018.

CLINICAL DATA: Dizziness.

EXAM:
CHEST - 2 VIEW

[w chest pa]
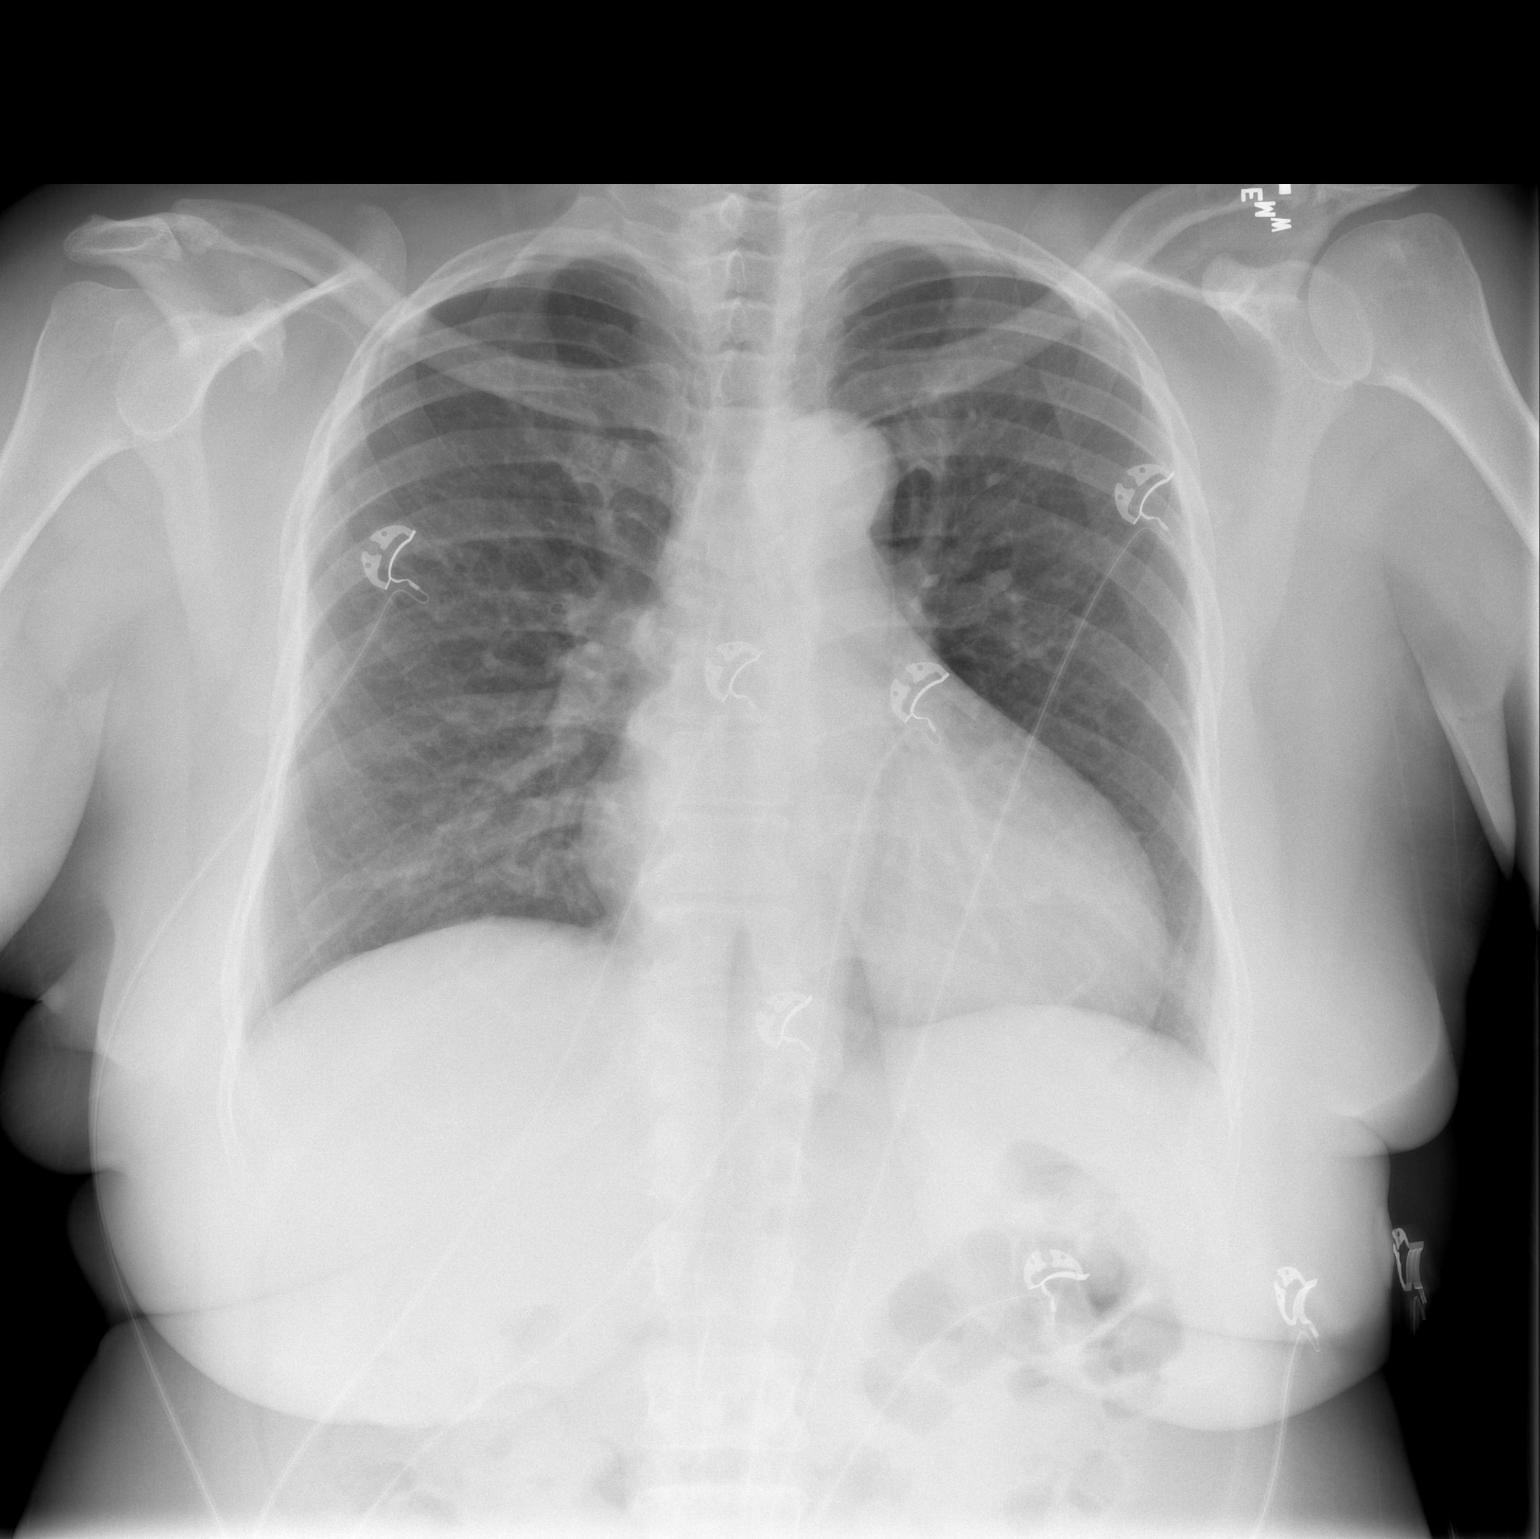

[w chest lat]
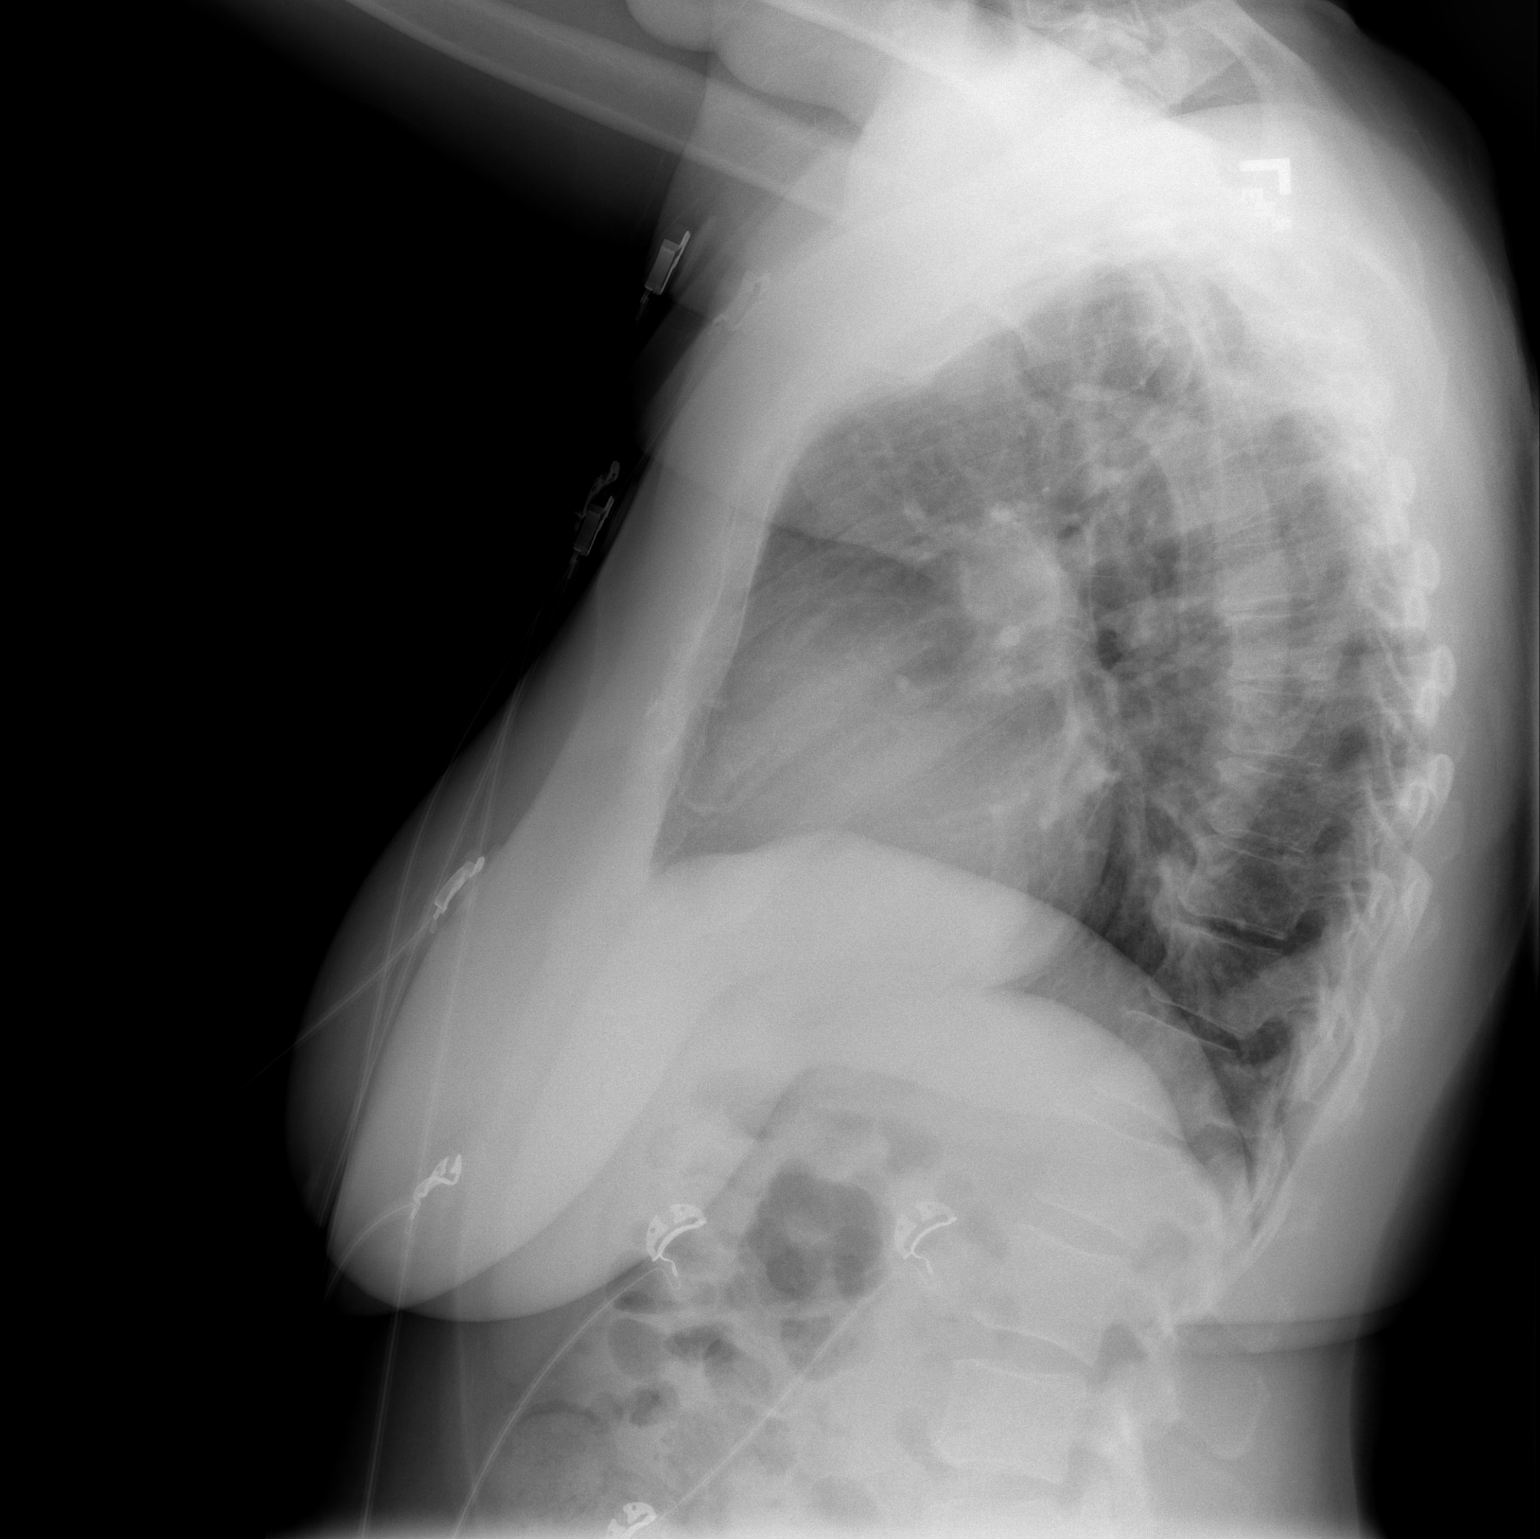

[2 of 2 positions shown; findings below may reference images not displayed]

FINDINGS: Stable mild cardiomegaly. Normal pulmonary vascularity. No focal
consolidation, pleural effusion, or pneumothorax. No acute osseous
abnormality.
IMPRESSION: 1. Stable mild cardiomegaly.  No active cardiopulmonary disease.

## 2019-11-02 IMAGING — DX CHEST - 2 VIEW
2 series · 2 of 2 positions shown · non-contrast
Comparison: 07/09/2018

CLINICAL DATA: Chest pain for several weeks

EXAM:
CHEST - 2 VIEW

[chest pa]
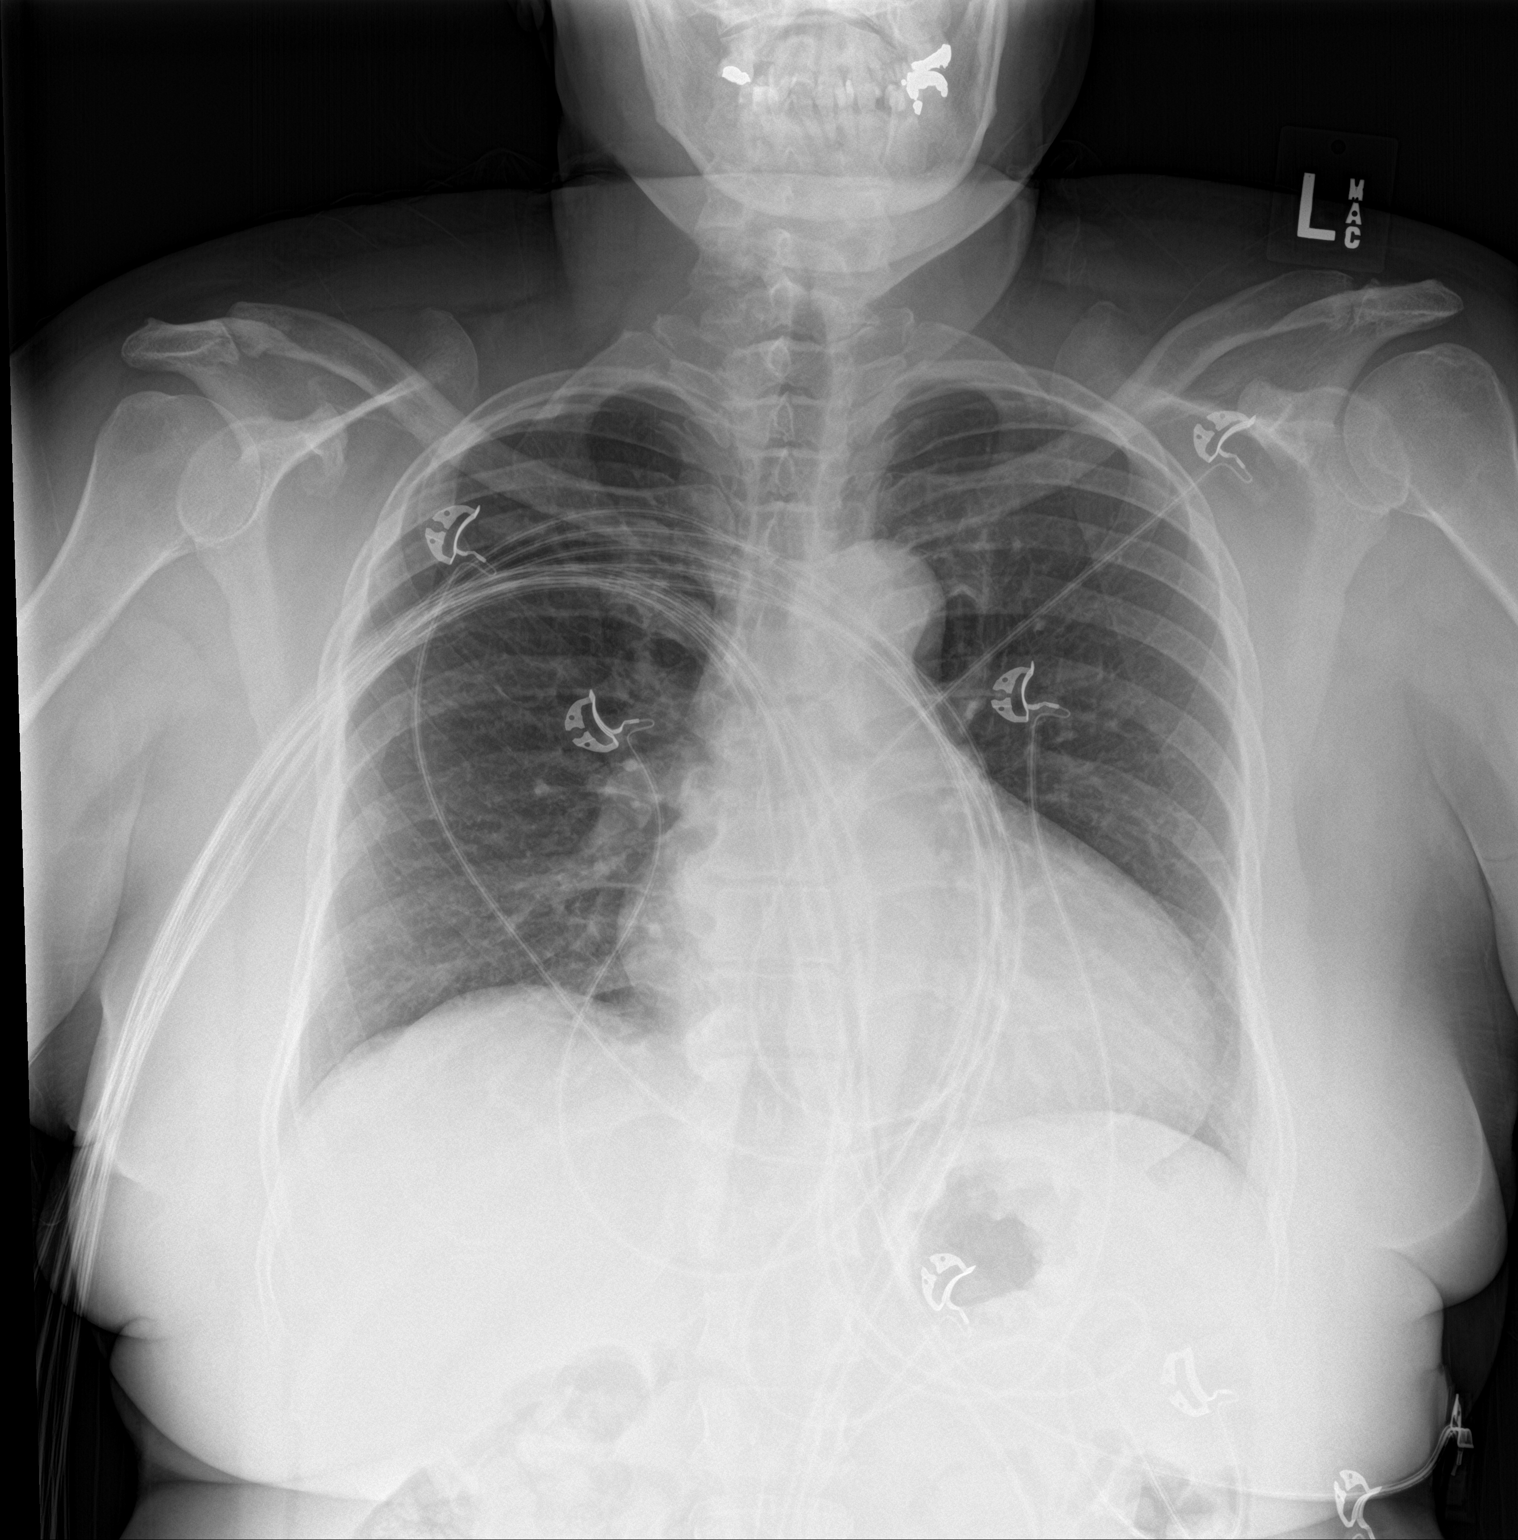

[chest lat]
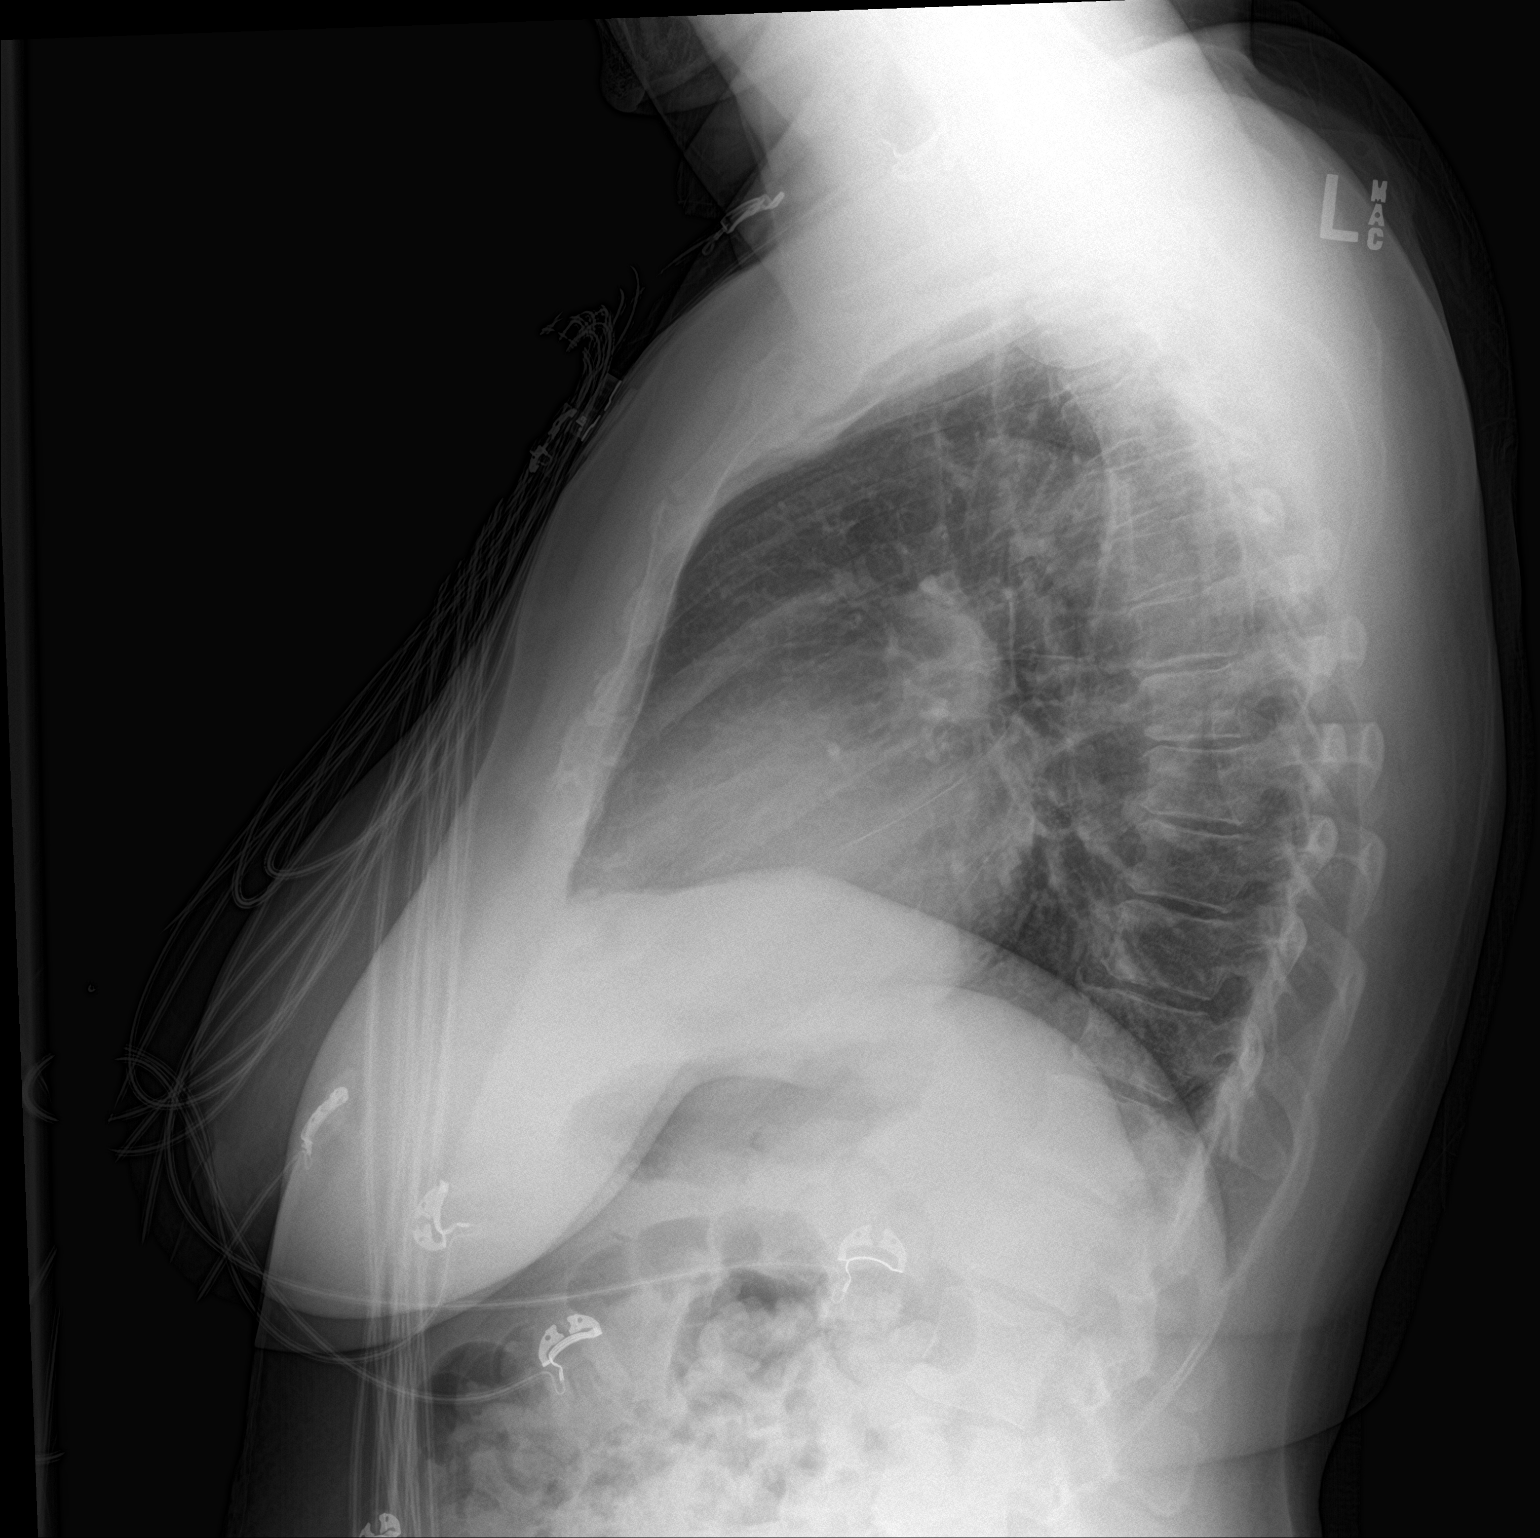

[2 of 2 positions shown; findings below may reference images not displayed]

FINDINGS: Cardiac shadow is mildly enlarged but stable. The lungs are well
aerated bilaterally. No focal infiltrate or sizable effusion is
seen. Mild degenerative changes of the thoracic spine are noted.
IMPRESSION: No acute abnormality seen.

## 2019-11-26 DIAGNOSIS — G4733 Obstructive sleep apnea (adult) (pediatric): Secondary | ICD-10-CM | POA: Diagnosis not present

## 2019-11-26 DIAGNOSIS — I480 Paroxysmal atrial fibrillation: Secondary | ICD-10-CM | POA: Diagnosis not present

## 2019-11-27 DIAGNOSIS — I480 Paroxysmal atrial fibrillation: Secondary | ICD-10-CM | POA: Diagnosis not present

## 2019-11-27 DIAGNOSIS — G4733 Obstructive sleep apnea (adult) (pediatric): Secondary | ICD-10-CM | POA: Diagnosis not present

## 2019-11-27 DIAGNOSIS — G4719 Other hypersomnia: Secondary | ICD-10-CM | POA: Diagnosis not present

## 2019-12-06 DIAGNOSIS — W208XXA Other cause of strike by thrown, projected or falling object, initial encounter: Secondary | ICD-10-CM | POA: Diagnosis not present

## 2019-12-06 DIAGNOSIS — S61411A Laceration without foreign body of right hand, initial encounter: Secondary | ICD-10-CM | POA: Diagnosis not present

## 2019-12-06 DIAGNOSIS — Y999 Unspecified external cause status: Secondary | ICD-10-CM | POA: Diagnosis not present

## 2019-12-15 DIAGNOSIS — S61411D Laceration without foreign body of right hand, subsequent encounter: Secondary | ICD-10-CM | POA: Diagnosis not present

## 2019-12-15 DIAGNOSIS — Z1231 Encounter for screening mammogram for malignant neoplasm of breast: Secondary | ICD-10-CM | POA: Diagnosis not present

## 2019-12-15 DIAGNOSIS — I10 Essential (primary) hypertension: Secondary | ICD-10-CM | POA: Diagnosis not present

## 2019-12-25 ENCOUNTER — Encounter (HOSPITAL_BASED_OUTPATIENT_CLINIC_OR_DEPARTMENT_OTHER): Payer: Self-pay

## 2019-12-25 ENCOUNTER — Emergency Department (HOSPITAL_BASED_OUTPATIENT_CLINIC_OR_DEPARTMENT_OTHER)
Admission: EM | Admit: 2019-12-25 | Discharge: 2019-12-25 | Disposition: A | Discharge status: Home / Self Care | Payer: BC Managed Care – PPO | Attending: Emergency Medicine | Admitting: Emergency Medicine

## 2019-12-25 ENCOUNTER — Other Ambulatory Visit: Payer: Self-pay

## 2019-12-25 DIAGNOSIS — Z5321 Procedure and treatment not carried out due to patient leaving prior to being seen by health care provider: Secondary | ICD-10-CM | POA: Insufficient documentation

## 2019-12-25 DIAGNOSIS — M545 Low back pain: Secondary | ICD-10-CM | POA: Insufficient documentation

## 2019-12-25 DIAGNOSIS — Z1231 Encounter for screening mammogram for malignant neoplasm of breast: Secondary | ICD-10-CM | POA: Diagnosis not present

## 2019-12-25 HISTORY — DX: Essential (primary) hypertension: I10

## 2019-12-25 NOTE — ED Notes (Signed)
Pt states she is leaving  

## 2019-12-25 NOTE — ED Triage Notes (Signed)
Pt arrives with c/o pain to lower right back states she went to pick something up from the floor and felt a catch in her right lower back.

## 2020-01-07 HISTORY — PX: BREAST BIOPSY: SHX20

## 2020-01-08 DIAGNOSIS — T148XXA Other injury of unspecified body region, initial encounter: Secondary | ICD-10-CM | POA: Diagnosis not present

## 2020-01-15 DIAGNOSIS — Z01818 Encounter for other preprocedural examination: Secondary | ICD-10-CM | POA: Diagnosis not present

## 2020-01-15 DIAGNOSIS — I10 Essential (primary) hypertension: Secondary | ICD-10-CM | POA: Diagnosis not present

## 2020-01-15 DIAGNOSIS — R079 Chest pain, unspecified: Secondary | ICD-10-CM | POA: Diagnosis not present

## 2020-01-15 DIAGNOSIS — E782 Mixed hyperlipidemia: Secondary | ICD-10-CM | POA: Diagnosis not present

## 2020-01-15 DIAGNOSIS — I480 Paroxysmal atrial fibrillation: Secondary | ICD-10-CM | POA: Diagnosis not present

## 2020-01-19 DIAGNOSIS — R928 Other abnormal and inconclusive findings on diagnostic imaging of breast: Secondary | ICD-10-CM | POA: Diagnosis not present

## 2020-01-19 DIAGNOSIS — N6489 Other specified disorders of breast: Secondary | ICD-10-CM | POA: Diagnosis not present

## 2020-01-22 DIAGNOSIS — H33321 Round hole, right eye: Secondary | ICD-10-CM | POA: Diagnosis not present

## 2020-01-22 DIAGNOSIS — H35372 Puckering of macula, left eye: Secondary | ICD-10-CM | POA: Diagnosis not present

## 2020-01-22 DIAGNOSIS — N6311 Unspecified lump in the right breast, upper outer quadrant: Secondary | ICD-10-CM | POA: Diagnosis not present

## 2020-01-22 DIAGNOSIS — H35413 Lattice degeneration of retina, bilateral: Secondary | ICD-10-CM | POA: Diagnosis not present

## 2020-01-22 DIAGNOSIS — D241 Benign neoplasm of right breast: Secondary | ICD-10-CM | POA: Diagnosis not present

## 2020-01-23 ENCOUNTER — Other Ambulatory Visit: Payer: Self-pay | Admitting: Orthopedic Surgery

## 2020-01-27 DIAGNOSIS — E669 Obesity, unspecified: Secondary | ICD-10-CM | POA: Diagnosis not present

## 2020-01-27 DIAGNOSIS — Z01818 Encounter for other preprocedural examination: Secondary | ICD-10-CM | POA: Diagnosis not present

## 2020-01-27 DIAGNOSIS — I480 Paroxysmal atrial fibrillation: Secondary | ICD-10-CM | POA: Diagnosis not present

## 2020-01-28 DIAGNOSIS — G4733 Obstructive sleep apnea (adult) (pediatric): Secondary | ICD-10-CM | POA: Diagnosis not present

## 2020-01-28 DIAGNOSIS — D369 Benign neoplasm, unspecified site: Secondary | ICD-10-CM | POA: Diagnosis not present

## 2020-01-28 DIAGNOSIS — I480 Paroxysmal atrial fibrillation: Secondary | ICD-10-CM | POA: Diagnosis not present

## 2020-01-28 DIAGNOSIS — Z7901 Long term (current) use of anticoagulants: Secondary | ICD-10-CM | POA: Diagnosis not present

## 2020-02-03 NOTE — Patient Instructions (Addendum)
DUE TO COVID-19 ONLY ONE VISITOR IS ALLOWED TO COME WITH YOU AND STAY IN THE WAITING ROOM ONLY DURING PRE OP AND PROCEDURE DAY OF SURGERY. THE 1 VISITOR  MAY VISIT WITH YOU AFTER SURGERY IN YOUR PRIVATE ROOM DURING VISITING HOURS ONLY!  YOU NEED TO HAVE A COVID 19 TEST ON: 02/12/20 @ 10:00 AM, THIS TEST MUST BE DONE BEFORE SURGERY,  COVID TESTING SITE [address] [place] [address], IT IS ON THE RIGHT GOING OUT [address] APPROXIMATELY  2 MINUTES PAST [employer] ON THE RIGHT. ONCE YOUR COVID TEST IS COMPLETED,  PLEASE BEGIN THE QUARANTINE INSTRUCTIONS AS OUTLINED IN YOUR HANDOUT.                Tanya Kelly    Your procedure is scheduled on: 02/16/20   Report to [facility] Main  Entrance   Report to admitting at: 11:30 AM     Call this number if you have problems the morning of surgery [phone]    Remember:   NO SOLID FOOD AFTER MIDNIGHT THE NIGHT PRIOR TO SURGERY. NOTHING BY MOUTH EXCEPT CLEAR LIQUIDS UNTIL: 11:00 AM . PLEASE FINISH ENSURE DRINK PER SURGEON ORDER  WHICH NEEDS TO BE COMPLETED AT: 11:00 AM .  CLEAR LIQUID DIET   Foods Allowed                                                                     Foods Excluded  Coffee and tea, regular and decaf                             liquids that you cannot  Plain Jell-O any favor except red or purple                                           see through such as: Fruit ices (not with fruit pulp)                                     milk, soups, orange juice  Iced Popsicles                                    All solid food Carbonated beverages, regular and diet                                    Cranberry, grape and apple juices Sports drinks like Gatorade Lightly seasoned clear broth or consume(fat free) Sugar, honey syrup  Sample Menu Breakfast                                Lunch  Supper Cranberry juice                    Beef broth                             Chicken broth Jell-O                                     Grape juice                           Apple juice Coffee or tea                        Jell-O                                      Popsicle                                                Coffee or tea                        Coffee or tea  _____________________________________________________________________   BRUSH YOUR TEETH MORNING OF SURGERY AND RINSE YOUR MOUTH OUT, NO CHEWING GUM CANDY OR MINTS.     Take these medicines the morning of surgery with A SIP OF WATER: amlodipine,gabapentin,metoprolol.                               You may not have any metal on your body including hair pins and              piercings  Do not wear jewelry, make-up, lotions, powders or perfumes, deodorant             Do not wear nail polish on your fingernails.  Do not shave  48 hours prior to surgery.    Do not bring valuables to the hospital. [facility] IS NOT             RESPONSIBLE   FOR VALUABLES.  Contacts, dentures or bridgework may not be worn into surgery.  Leave suitcase in the car. After surgery it may be brought to your room.     Patients discharged the day of surgery will not be allowed to drive home. IF YOU ARE HAVING SURGERY AND GOING HOME THE SAME DAY, YOU MUST HAVE AN ADULT TO DRIVE YOU HOME AND BE WITH YOU FOR 24 HOURS. YOU MAY GO HOME BY TAXI OR UBER OR ORTHERWISE, BUT AN ADULT MUST ACCOMPANY YOU HOME AND STAY WITH YOU FOR 24 HOURS.  Name and phone number of your driver:  Special Instructions: N/A              Please read over the following fact sheets you were given: _____________________________________________________________________          [facility] - Preparing for Surgery Before surgery, you can play an important role.  Because skin is not sterile, your skin needs to be as free of germs as possible.  You can reduce the number of germs on your skin by washing with CHG (chlorahexidine gluconate) soap before  surgery.  CHG is an antiseptic cleaner which kills germs and bonds with the skin to continue killing germs even after washing. Please DO NOT use if you have an allergy to CHG or antibacterial soaps.  If your skin becomes reddened/irritated stop using the CHG and inform your nurse when you arrive at Short Stay. Do not shave (including legs and underarms) for at least 48 hours prior to the first CHG shower.  You may shave your face/neck. Please follow these instructions carefully:  1.  Shower with CHG Soap the night before surgery and the  morning of Surgery.  2.  If you choose to wash your hair, wash your hair first as usual with your  normal  shampoo.  3.  After you shampoo, rinse your hair and body thoroughly to remove the  shampoo.                           4.  Use CHG as you would any other liquid soap.  You can apply chg directly  to the skin and wash                       Gently with a scrungie or clean washcloth.  5.  Apply the CHG Soap to your body ONLY FROM THE NECK DOWN.   Do not use on face/ open                           Wound or open sores. Avoid contact with eyes, ears mouth and genitals (private parts).                       Wash face,  Genitals (private parts) with your normal soap.             6.  Wash thoroughly, paying special attention to the area where your surgery  will be performed.  7.  Thoroughly rinse your body with warm water from the neck down.  8.  DO NOT shower/wash with your normal soap after using and rinsing off  the CHG Soap.                9.  Pat yourself dry with a clean towel.            10.  Wear clean pajamas.            11.  Place clean sheets on your bed the night of your first shower and do not  sleep with pets. Day of Surgery : Do not apply any lotions/deodorants the morning of surgery.  Please wear clean clothes to the hospital/surgery center.  FAILURE TO FOLLOW THESE INSTRUCTIONS MAY RESULT IN THE CANCELLATION OF YOUR SURGERY PATIENT  SIGNATURE_________________________________  NURSE SIGNATURE__________________________________  ________________________________________________________________________   Tanya Kelly  An incentive spirometer is a tool that can help keep your lungs clear and active. This tool measures how well you are filling your lungs with each breath. Taking long deep breaths may help reverse or decrease the chance of developing breathing (pulmonary) problems (especially infection) following:  A long period of time when you are unable to move or be active. BEFORE THE PROCEDURE   If the spirometer includes an indicator to show your best effort, your nurse or respiratory therapist will  set it to a desired goal.  If possible, sit up straight or lean slightly forward. Try not to slouch.  Hold the incentive spirometer in an upright position. INSTRUCTIONS FOR USE  1. Sit on the edge of your bed if possible, or sit up as far as you can in bed or on a chair. 2. Hold the incentive spirometer in an upright position. 3. Breathe out normally. 4. Place the mouthpiece in your mouth and seal your lips tightly around it. 5. Breathe in slowly and as deeply as possible, raising the piston or the ball toward the top of the column. 6. Hold your breath for 3-5 seconds or for as long as possible. Allow the piston or ball to fall to the bottom of the column. 7. Remove the mouthpiece from your mouth and breathe out normally. 8. Rest for a few seconds and repeat Steps 1 through 7 at least 10 times every 1-2 hours when you are awake. Take your time and take a few normal breaths between deep breaths. 9. The spirometer may include an indicator to show your best effort. Use the indicator as a goal to work toward during each repetition. 10. After each set of 10 deep breaths, practice coughing to be sure your lungs are clear. If you have an incision (the cut made at the time of surgery), support your incision when coughing  by placing a pillow or rolled up towels firmly against it. Once you are able to get out of bed, walk around indoors and cough well. You may stop using the incentive spirometer when instructed by your caregiver.  RISKS AND COMPLICATIONS  Take your time so you do not get dizzy or light-headed.  If you are in pain, you may need to take or ask for pain medication before doing incentive spirometry. It is harder to take a deep breath if you are having pain. AFTER USE  Rest and breathe slowly and easily.  It can be helpful to keep track of a log of your progress. Your caregiver can provide you with a simple table to help with this. If you are using the spirometer at home, follow these instructions: [place] IF:   You are having difficultly using the spirometer.  You have trouble using the spirometer as often as instructed.  Your pain medication is not giving enough relief while using the spirometer.  You develop fever of 100.5 F (38.1 C) or higher. SEEK IMMEDIATE MEDICAL CARE IF:   You cough up bloody sputum that had not been present before.  You develop fever of 102 F (38.9 C) or greater.  You develop worsening pain at or near the incision site. MAKE SURE YOU:   Understand these instructions.  Will watch your condition.  Will get help right away if you are not doing well or get worse. Document Released: 09/04/2006 Document Revised: 07/17/2011 Document Reviewed: 11/05/2006 [facility] Patient Information [address], [place].   ________________________________________________________________________

## 2020-02-04 ENCOUNTER — Ambulatory Visit (HOSPITAL_COMMUNITY)
Admission: RE | Admit: 2020-02-04 | Discharge: 2020-02-04 | Disposition: A | Discharge status: Home / Self Care | Payer: No Typology Code available for payment source | Source: Ambulatory Visit | Attending: Orthopedic Surgery | Admitting: Orthopedic Surgery

## 2020-02-04 ENCOUNTER — Encounter (HOSPITAL_COMMUNITY): Payer: Self-pay

## 2020-02-04 ENCOUNTER — Encounter (HOSPITAL_COMMUNITY)
Admission: RE | Admit: 2020-02-04 | Discharge: 2020-02-04 | Disposition: A | Discharge status: Home / Self Care | Payer: BC Managed Care – PPO | Source: Ambulatory Visit | Attending: Orthopedic Surgery | Admitting: Orthopedic Surgery

## 2020-02-04 ENCOUNTER — Other Ambulatory Visit: Payer: Self-pay

## 2020-02-04 DIAGNOSIS — Z79899 Other long term (current) drug therapy: Secondary | ICD-10-CM | POA: Insufficient documentation

## 2020-02-04 DIAGNOSIS — I10 Essential (primary) hypertension: Secondary | ICD-10-CM | POA: Diagnosis not present

## 2020-02-04 DIAGNOSIS — M1712 Unilateral primary osteoarthritis, left knee: Secondary | ICD-10-CM | POA: Insufficient documentation

## 2020-02-04 DIAGNOSIS — I480 Paroxysmal atrial fibrillation: Secondary | ICD-10-CM | POA: Insufficient documentation

## 2020-02-04 DIAGNOSIS — Z01818 Encounter for other preprocedural examination: Secondary | ICD-10-CM | POA: Diagnosis not present

## 2020-02-04 DIAGNOSIS — Z7982 Long term (current) use of aspirin: Secondary | ICD-10-CM | POA: Insufficient documentation

## 2020-02-04 DIAGNOSIS — Z7901 Long term (current) use of anticoagulants: Secondary | ICD-10-CM | POA: Insufficient documentation

## 2020-02-04 HISTORY — DX: Angina pectoris, unspecified: I20.9

## 2020-02-04 HISTORY — DX: Cardiac arrhythmia, unspecified: I49.9

## 2020-02-04 HISTORY — DX: Unspecified osteoarthritis, unspecified site: M19.90

## 2020-02-04 HISTORY — DX: Dyspnea, unspecified: R06.00

## 2020-02-04 LAB — URINALYSIS, ROUTINE W REFLEX MICROSCOPIC
Bilirubin Urine: NEGATIVE
Glucose, UA: NEGATIVE mg/dL
Hgb urine dipstick: NEGATIVE
Ketones, ur: NEGATIVE mg/dL
Leukocytes,Ua: NEGATIVE
Nitrite: NEGATIVE
Protein, ur: NEGATIVE mg/dL
Specific Gravity, Urine: 1.021 (ref 1.005–1.030)
pH: 6 (ref 5.0–8.0)

## 2020-02-04 LAB — TYPE AND SCREEN
ABO/RH(D): AB POS
Antibody Screen: NEGATIVE

## 2020-02-04 LAB — BASIC METABOLIC PANEL
Anion gap: 7 (ref 5–15)
BUN: 13 mg/dL (ref 6–20)
CO2: 26 mmol/L (ref 22–32)
Calcium: 9.1 mg/dL (ref 8.9–10.3)
Chloride: 105 mmol/L (ref 98–111)
Creatinine, Ser: 0.67 mg/dL (ref 0.44–1.00)
GFR calc Af Amer: >60 mL/min (ref >60)
GFR calc non Af Amer: >60 mL/min (ref >60)
Glucose, Bld: 101 mg/dL — ABNORMAL HIGH (ref 70–99)
Potassium: 4 mmol/L (ref 3.5–5.1)
Sodium: 138 mmol/L (ref 135–145)

## 2020-02-04 LAB — SURGICAL PCR SCREEN
MRSA, PCR: NEGATIVE
Staphylococcus aureus: NEGATIVE

## 2020-02-04 LAB — CBC WITH DIFFERENTIAL/PLATELET
Abs Immature Granulocytes: 0.01 10*3/uL (ref 0.00–0.07)
Basophils Absolute: 0.1 10*3/uL (ref 0.0–0.1)
Basophils Relative: 1 %
Eosinophils Absolute: 0.2 10*3/uL (ref 0.0–0.5)
Eosinophils Relative: 3 %
HCT: 38.5 % (ref 36.0–46.0)
Hemoglobin: 12.1 g/dL (ref 12.0–15.0)
Immature Granulocytes: 0 %
Lymphocytes Relative: 37 %
Lymphs Abs: 2.1 10*3/uL (ref 0.7–4.0)
MCH: 27.4 pg (ref 26.0–34.0)
MCHC: 31.4 g/dL (ref 30.0–36.0)
MCV: 87.1 fL (ref 80.0–100.0)
Monocytes Absolute: 0.5 10*3/uL (ref 0.1–1.0)
Monocytes Relative: 9 %
Neutro Abs: 2.9 10*3/uL (ref 1.7–7.7)
Neutrophils Relative %: 50 %
Platelets: 202 10*3/uL (ref 150–400)
RBC: 4.42 MIL/uL (ref 3.87–5.11)
RDW: 13.2 % (ref 11.5–15.5)
WBC: 5.7 10*3/uL (ref 4.0–10.5)
nRBC: 0 % (ref 0.0–0.2)

## 2020-02-04 LAB — PROTIME-INR
INR: 1.2 (ref 0.8–1.2)
Prothrombin Time: 14.3 seconds (ref 11.4–15.2)

## 2020-02-04 LAB — APTT: aPTT: 28 seconds (ref 24–36)

## 2020-02-11 NOTE — Progress Notes (Signed)
EKG 07/10/19 received and placed in chart

## 2020-02-12 ENCOUNTER — Other Ambulatory Visit (HOSPITAL_COMMUNITY)
Admission: RE | Admit: 2020-02-12 | Discharge: 2020-02-12 | Disposition: A | Discharge status: Home / Self Care | Payer: BC Managed Care – PPO | Source: Ambulatory Visit | Attending: Orthopedic Surgery | Admitting: Orthopedic Surgery

## 2020-02-12 DIAGNOSIS — Z20822 Contact with and (suspected) exposure to covid-19: Secondary | ICD-10-CM | POA: Insufficient documentation

## 2020-02-12 DIAGNOSIS — Z01818 Encounter for other preprocedural examination: Secondary | ICD-10-CM | POA: Diagnosis not present

## 2020-02-12 LAB — SARS CORONAVIRUS 2 (TAT 6-24 HRS): SARS Coronavirus 2: NEGATIVE

## 2020-02-12 NOTE — Anesthesia Preprocedure Evaluation (Addendum)
Anesthesia Evaluation  Patient identified by MRN, date of birth, ID band Patient awake    Reviewed: Allergy & Precautions, NPO status , Patient's Chart, lab work & pertinent test results, reviewed documented beta blocker date and time   Airway Mallampati: II  TM Distance: >3 FB Neck ROM: Full    Dental  (+) Teeth Intact   Pulmonary sleep apnea ,    Pulmonary exam normal breath sounds clear to auscultation       Cardiovascular hypertension, Pt. on medications and Pt. on home beta blockers + angina Normal cardiovascular exam+ dysrhythmias Atrial Fibrillation  Rhythm:Regular Rate:Normal     Neuro/Psych  Headaches, negative psych ROS   GI/Hepatic negative GI ROS, Neg liver ROS,   Endo/Other  Obesity   Renal/GU negative Renal ROS     Musculoskeletal  (+) Arthritis ,   Abdominal   Peds  Hematology  (+) Blood dyscrasia (Eliquis), , Plt 202k   Anesthesia Other Findings Day of surgery medications reviewed with the patient.  Reproductive/Obstetrics                           Anesthesia Physical Anesthesia Plan  ASA: II  Anesthesia Plan: Spinal   Post-op Pain Management:  Regional for Post-op pain   Induction: Intravenous  PONV Risk Score and Plan: 2 and Propofol infusion, Ondansetron and Treatment may vary due to age or medical condition  Airway Management Planned: Natural Airway and Nasal Cannula  Additional Equipment:   Intra-op Plan:   Post-operative Plan:   Informed Consent: I have reviewed the patients History and Physical, chart, labs and discussed the procedure including the risks, benefits and alternatives for the proposed anesthesia with the patient or authorized representative who has indicated his/her understanding and acceptance.       Plan Discussed with: CRNA  Anesthesia Plan Comments: (See PAT note 02/04/2020, Jodell Cipro, PA-C)       Anesthesia Quick  Evaluation

## 2020-02-12 NOTE — Progress Notes (Signed)
Anesthesia Chart Review   Case: [id number] Date/Time: 02/16/20 1325   Procedure: LEFT TOTAL KNEE ARTHROPLASTY (Left Knee)   Anesthesia type: Spinal   Pre-op diagnosis: LEFT KNEE OSTEOARTHRITIS   Location: [facility] ROOM 06 / [facility]   Surgeons: Gean Birchwood, MD      DISCUSSION:54 y.o. never smoker with h/o HTN, PAF (on Eliquis), left knee OA scheduled for above procedure 02/16/2020 with Dr. Gean Birchwood.   Per cardiology note 01/15/2020, "RCRI 0 with periop MACE risk 3.9%.  No angina or equivalent and no inducible ischemia on recent stress test.  No further w/u or treatment will modify the risk.  Can proceed with surgery."  Pt was advised by PCP to hold Eliquis 5 days prior to procedure.   Anticipate pt can proceed with planned procedure barring acute status change.   VS: BP 120/80   Pulse 61   Temp 36.5 C (Oral)   Ht 5\' 1"  (1.549 m)   Wt 91.2 kg   LMP 11/17/2019   BMI 37.98 kg/m   PROVIDERS: 01/18/2020, PA-C is PCP    LABS: Labs reviewed: Acceptable for surgery. (all labs ordered are listed, but only abnormal results are displayed)  Labs Reviewed  BASIC METABOLIC PANEL - Abnormal; Notable for the following components:      Result Value   Glucose, Bld 101 (*)    All other components within normal limits  SURGICAL PCR SCREEN  APTT  CBC WITH DIFFERENTIAL/PLATELET  PROTIME-INR  URINALYSIS, ROUTINE W REFLEX MICROSCOPIC  TYPE AND SCREEN     IMAGES:   EKG: On chart   CV: Stress Echo 08/11/2019 SUMMARY  The patient had no chest pain.  The patient achieved 91 % of maximum predicted heart rate.  The METS achieved was 10.10.  Exercise capacity was average.  The baseline ECG displays normal sinus rhythm.  Nondiagnostic stress ECG due to artifacts.  Normal left ventricular function at rest.  There were no segmental wall motion abnormalities at rest  Negative exercise echocardiography for inducible ischemia at target  heart rate.   Echo 07/07/2019 SUMMARY   The left  ventricular size is normal. There is normal left ventricular  wall thickness. The left ventricular wall motion is normal. Left  ventricular systolic function is normal. LV ejection fraction = 55-  60%.  Left ventricular filling pattern is normal.  There is no significant valvular stenosis or regurgitation.  RVSP not able to be calculated.   Past Medical History:  Diagnosis Date  . Anginal pain (HCC)   . Arthritis   . Dyspnea   . Dysrhythmia    A-fib  . Hypertension     Past Surgical History:  Procedure Laterality Date  . BREAST BIOPSY  01/2020  . CESAREAN SECTION    . VARICOSE VEIN SURGERY      MEDICATIONS: . amLODipine (NORVASC) 5 MG tablet  . apixaban (ELIQUIS) 5 MG TABS tablet  . aspirin EC 81 MG tablet  . atorvastatin (LIPITOR) 20 MG tablet  . cholecalciferol (VITAMIN D3) 25 MCG (1000 UNIT) tablet  . Dapsone 5 % topical gel  . diclofenac (VOLTAREN) 75 MG EC tablet  . gabapentin (NEURONTIN) 100 MG capsule  . methocarbamol (ROBAXIN) 750 MG tablet  . metoprolol succinate (TOPROL-XL) 25 MG 24 hr tablet  . minocycline (MINOCIN,DYNACIN) 100 MG capsule  . PREVIDENT 5000 SENSITIVE 1.1-5 % PSTE  . spironolactone (ALDACTONE) 50 MG tablet  . SULFACLEANSE 8/4 8-4 % SUSP   No current facility-administered medications for this encounter.  Jodell Cipro, PA-C [facility] Pre-Surgical Testing [phone]

## 2020-02-13 DIAGNOSIS — M1712 Unilateral primary osteoarthritis, left knee: Secondary | ICD-10-CM | POA: Diagnosis present

## 2020-02-13 NOTE — H&P (Signed)
TOTAL KNEE ADMISSION H&P  Patient is being admitted for left total knee arthroplasty.  Subjective:  Chief Complaint:left knee pain.  HPI: Tanya Kelly, 54 y.o. female, has a history of pain and functional disability in the left knee due to trauma and arthritis and has failed non-surgical conservative treatments for greater than 12 weeks to includeNSAID's and/or analgesics, corticosteriod injections, viscosupplementation injections, flexibility and strengthening excercises, supervised PT with diminished ADL's post treatment, use of assistive devices and activity modification.  Onset of symptoms was abrupt, starting 2 years ago with gradually worsening course since that time. The patient noted no past surgery on the left knee(s).  Patient currently rates pain in the left knee(s) at 10 out of 10 with activity. Patient has night pain, worsening of pain with activity and weight bearing, pain that interferes with activities of daily living, pain with passive range of motion, crepitus and joint swelling.  Patient has evidence of periarticular osteophytes and joint space narrowing by imaging studies.  There is no active infection.  Patient Active Problem List   Diagnosis Date Noted  . Degenerative arthritis of left knee 02/13/2020  . Right knee pain 06/15/2017  . Onychomycosis due to dermatophyte 04/09/2017  . Tinea pedis of both feet 04/09/2017  . 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 Fuchs' corneal dystrophy 12/27/2015  . Leg pain 12/27/2015  . Macular retinal cyst of left eye 12/27/2015  . Migraine headache 12/27/2015  . Nuclear sclerotic cataract of both eyes 12/27/2015   Past Medical History:  Diagnosis Date  . Anginal pain (HCC)   . Arthritis   . Dyspnea   . Dysrhythmia    A-fib  . Hypertension     Past Surgical History:  Procedure Laterality Date  . BREAST BIOPSY  01/2020  . CESAREAN SECTION    . VARICOSE VEIN SURGERY      No current facility-administered medications  for this encounter.   Current Outpatient Medications  Medication Sig Dispense Refill Last Dose  . amLODipine (NORVASC) 5 MG tablet Take 1 tablet (5 mg total) by mouth daily. 30 tablet 1   . apixaban (ELIQUIS) 5 MG TABS tablet Take by mouth.     Marland Kitchen atorvastatin (LIPITOR) 20 MG tablet Take 20 mg by mouth daily.     . cholecalciferol (VITAMIN D3) 25 MCG (1000 UNIT) tablet Take 1,000 Units by mouth daily.     . Dapsone 5 % topical gel Apply 1 application topically in the morning.      . gabapentin (NEURONTIN) 100 MG capsule Take 100 mg by mouth in the morning, at noon, and at bedtime.     . methocarbamol (ROBAXIN) 750 MG tablet Take 750 mg by mouth every 6 (six) hours as needed for muscle spasms.     . metoprolol succinate (TOPROL-XL) 25 MG 24 hr tablet Take 25 mg by mouth daily.      Marland Kitchen PREVIDENT 5000 SENSITIVE 1.1-5 % PSTE Place 1 application onto teeth every other day.      . SULFACLEANSE 8/4 8-4 % SUSP Apply 1 application topically in the morning.      Marland Kitchen aspirin EC 81 MG tablet Take 81 mg by mouth. (Patient not taking: Reported on 01/28/2020)   Not Taking at Unknown time  . diclofenac (VOLTAREN) 75 MG EC tablet Take 1 tablet (75 mg total) by mouth 2 (two) times daily. (Patient not taking: Reported on 01/28/2020) 60 tablet 1 Not Taking at Unknown time  . minocycline (MINOCIN,DYNACIN) 100 MG capsule TK  1 C PO D (Patient not taking: Reported on 01/28/2020)   Not Taking at Unknown time  . spironolactone (ALDACTONE) 50 MG tablet TK 1 T PO D (Patient not taking: Reported on 01/28/2020)  2 Not Taking at Unknown time   Allergies  Allergen Reactions  . Codeine     migraines    Social History   Tobacco Use  . Smoking status: Never Smoker  . Smokeless tobacco: Never Used  Substance Use Topics  . Alcohol use: Yes    Comment: socially    No family history on file.   Review of Systems  Constitutional: Positive for diaphoresis.  HENT: Negative.   Eyes: Negative.        Blurred vision  Respiratory:  Negative.   Cardiovascular: Positive for chest pain and leg swelling.       HTN  Endocrine: Negative.   Genitourinary: Negative.   Musculoskeletal: Positive for arthralgias and myalgias.  Skin: Negative.   Allergic/Immunologic: Negative.   Neurological: Negative.   Hematological: Negative.   Psychiatric/Behavioral: The patient is nervous/anxious.     Objective:  Physical Exam Constitutional:      Appearance: Normal appearance. She is normal weight.  HENT:     Head: Normocephalic and atraumatic.     Nose: Nose normal.  Eyes:     Pupils: Pupils are equal, round, and reactive to light.  Cardiovascular:     Pulses: Normal pulses.  Pulmonary:     Effort: Pulmonary effort is normal.  Musculoskeletal:        General: Tenderness present.     Cervical back: Normal range of motion and neck supple.     Comments: Obvious varus deformity to the left knee tender along the medial joint line range of motion 5/125 collateral ligaments are stable varus stress does exacerbate and cause her pain she does have a 1+ effusion.  Toes are pink and well perfused neurovascular intact.  Skin:    General: Skin is warm and dry.  Neurological:     General: No focal deficit present.     Mental Status: She is alert and oriented to person, place, and time. Mental status is at baseline.  Psychiatric:        Mood and Affect: Mood normal.        Behavior: Behavior normal.        Thought Content: Thought content normal.        Judgment: Judgment normal.     Vital signs in last 24 hours:    Labs:   Estimated body mass index is 37.98 kg/m as calculated from the following:   Height as of 02/04/20: 5\' 1"  (1.549 m).   Weight as of 02/04/20: 91.2 kg.   Imaging Review Plain radiographs demonstrate  bilateral AP weightbearing, bilateral Rosenberg, lateral and sunrise views of the right and left knees are taken and reviewed in office today.  Patient does have moderate to severe arthritis medial compartment  bilateral knees with periarticular osteophyte formation.    Assessment/Plan:  End stage arthritis, left knee   The patient history, physical examination, clinical judgment of the provider and imaging studies are consistent with end stage degenerative joint disease of the left knee(s) and total knee arthroplasty is deemed medically necessary. The treatment options including medical management, injection therapy arthroscopy and arthroplasty were discussed at length. The risks and benefits of total knee arthroplasty were presented and reviewed. The risks due to aseptic loosening, infection, stiffness, patella tracking problems, thromboembolic complications and other imponderables  were discussed. The patient acknowledged the explanation, agreed to proceed with the plan and consent was signed. Patient is being admitted for inpatient treatment for surgery, pain control, PT, OT, prophylactic antibiotics, VTE prophylaxis, progressive ambulation and ADL's and discharge planning. The patient is planning to be discharged home with home health services     Patient's anticipated LOS is less than 2 midnights, meeting these requirements: - Younger than 83 - Lives within 1 hour of care - Has a competent adult at home to recover with post-op recover - NO history of  - Chronic pain requiring opiods  - Diabetes  - Coronary Artery Disease  - Heart failure  - Heart attack  - Stroke  - DVT/VTE  - Cardiac arrhythmia  - Respiratory Failure/COPD  - Renal failure  - Anemia  - Advanced Liver disease

## 2020-02-13 NOTE — Progress Notes (Signed)
Called patient about time change for surgery on 02/16/20. Patient to arrive 0915 for 1215. Complete ERAS drink by 0915 on Monday. She verbalizes understanding.

## 2020-02-14 MED ORDER — TRANEXAMIC ACID 1000 MG/10ML IV SOLN
2000.0000 mg | INTRAVENOUS | Status: DC
  Filled 2020-02-14: qty 20

## 2020-02-14 MED ORDER — BUPIVACAINE LIPOSOME 1.3 % IJ SUSP
20.0000 mL | INTRAMUSCULAR | Status: DC
  Filled 2020-02-14: qty 20

## 2020-02-16 ENCOUNTER — Ambulatory Visit (HOSPITAL_COMMUNITY): Payer: No Typology Code available for payment source | Admitting: Physician Assistant

## 2020-02-16 ENCOUNTER — Ambulatory Visit (HOSPITAL_COMMUNITY): Payer: No Typology Code available for payment source | Admitting: Certified Registered Nurse Anesthetist

## 2020-02-16 ENCOUNTER — Ambulatory Visit (HOSPITAL_COMMUNITY)
Admission: RE | Admit: 2020-02-16 | Discharge: 2020-02-16 | Disposition: A | Discharge status: Home / Self Care | Payer: No Typology Code available for payment source | Attending: Orthopedic Surgery | Admitting: Orthopedic Surgery

## 2020-02-16 ENCOUNTER — Encounter (HOSPITAL_COMMUNITY): Payer: Self-pay | Admitting: Orthopedic Surgery

## 2020-02-16 ENCOUNTER — Encounter (HOSPITAL_COMMUNITY)
Admission: RE | Disposition: A | Discharge status: Home / Self Care | Payer: Self-pay | Source: Home / Self Care | Attending: Orthopedic Surgery

## 2020-02-16 DIAGNOSIS — M1712 Unilateral primary osteoarthritis, left knee: Secondary | ICD-10-CM | POA: Diagnosis not present

## 2020-02-16 DIAGNOSIS — Z7982 Long term (current) use of aspirin: Secondary | ICD-10-CM | POA: Diagnosis not present

## 2020-02-16 DIAGNOSIS — Z79899 Other long term (current) drug therapy: Secondary | ICD-10-CM | POA: Diagnosis not present

## 2020-02-16 DIAGNOSIS — I10 Essential (primary) hypertension: Secondary | ICD-10-CM | POA: Diagnosis not present

## 2020-02-16 DIAGNOSIS — Z7901 Long term (current) use of anticoagulants: Secondary | ICD-10-CM | POA: Insufficient documentation

## 2020-02-16 DIAGNOSIS — Z885 Allergy status to narcotic agent status: Secondary | ICD-10-CM | POA: Insufficient documentation

## 2020-02-16 HISTORY — PX: TOTAL KNEE ARTHROPLASTY: SHX125

## 2020-02-16 LAB — PREGNANCY, URINE: Preg Test, Ur: NEGATIVE

## 2020-02-16 LAB — ABO/RH: ABO/RH(D): AB POS

## 2020-02-16 SURGERY — ARTHROPLASTY, KNEE, TOTAL
Anesthesia: Spinal | Site: Knee | Laterality: Left

## 2020-02-16 MED ORDER — LIDOCAINE HCL (CARDIAC) PF 100 MG/5ML IV SOSY
PREFILLED_SYRINGE | INTRAVENOUS | Status: DC | PRN
  Administered 2020-02-16: 60 mg via INTRAVENOUS

## 2020-02-16 MED ORDER — HYDROMORPHONE HCL 2 MG PO TABS: 2.0000 mg | ORAL_TABLET | ORAL | 0 refills | Status: AC | PRN

## 2020-02-16 MED ORDER — LIDOCAINE 2% (20 MG/ML) 5 ML SYRINGE
INTRAMUSCULAR | Status: AC
  Filled 2020-02-16: qty 5

## 2020-02-16 MED ORDER — ONDANSETRON HCL 4 MG/2ML IJ SOLN
INTRAMUSCULAR | Status: DC | PRN
  Administered 2020-02-16: 4 mg via INTRAVENOUS

## 2020-02-16 MED ORDER — TRANEXAMIC ACID 1000 MG/10ML IV SOLN
INTRAVENOUS | Status: DC | PRN
  Administered 2020-02-16: 2000 mg via TOPICAL

## 2020-02-16 MED ORDER — ORAL CARE MOUTH RINSE: 15.0000 mL | Freq: Once | OROMUCOSAL | Status: AC

## 2020-02-16 MED ORDER — OXYCODONE HCL 5 MG PO TABS
ORAL_TABLET | ORAL | Status: DC
Start: 2020-02-16 — End: 2020-02-17
  Filled 2020-02-16: qty 2

## 2020-02-16 MED ORDER — CEFAZOLIN SODIUM-DEXTROSE 2-4 GM/100ML-% IV SOLN
2.0000 g | INTRAVENOUS | Status: AC
  Administered 2020-02-16: 2 g via INTRAVENOUS
  Filled 2020-02-16: qty 100

## 2020-02-16 MED ORDER — LACTATED RINGERS IV SOLN: INTRAVENOUS | Status: DC

## 2020-02-16 MED ORDER — DEXAMETHASONE SODIUM PHOSPHATE 10 MG/ML IJ SOLN
INTRAMUSCULAR | Status: DC | PRN
  Administered 2020-02-16: 10 mg via INTRAVENOUS

## 2020-02-16 MED ORDER — CHLORHEXIDINE GLUCONATE 0.12 % MT SOLN
15.0000 mL | Freq: Once | OROMUCOSAL | Status: AC
  Administered 2020-02-16: 15 mL via OROMUCOSAL

## 2020-02-16 MED ORDER — LACTATED RINGERS IV SOLN: INTRAVENOUS | Status: DC | PRN

## 2020-02-16 MED ORDER — PROMETHAZINE HCL 25 MG/ML IJ SOLN: 6.2500 mg | INTRAMUSCULAR | Status: DC | PRN

## 2020-02-16 MED ORDER — MEPIVACAINE HCL (PF) 2 % IJ SOLN
INTRAMUSCULAR | Status: DC | PRN
  Administered 2020-02-16: 3 mL via INTRATHECAL

## 2020-02-16 MED ORDER — METHOCARBAMOL 500 MG PO TABS: 750.0000 mg | ORAL_TABLET | Freq: Four times a day (QID) | ORAL | Status: DC | PRN

## 2020-02-16 MED ORDER — FENTANYL CITRATE (PF) 100 MCG/2ML IJ SOLN
50.0000 ug | Freq: Once | INTRAMUSCULAR | Status: AC
  Administered 2020-02-16: 50 ug via INTRAVENOUS
  Filled 2020-02-16: qty 2

## 2020-02-16 MED ORDER — SODIUM CHLORIDE (PF) 0.9 % IJ SOLN
INTRAMUSCULAR | Status: DC | PRN
  Administered 2020-02-16: 70 mL

## 2020-02-16 MED ORDER — SODIUM CHLORIDE 0.9 % IR SOLN
Status: DC | PRN
  Administered 2020-02-16: 1000 mL

## 2020-02-16 MED ORDER — MIDAZOLAM HCL 2 MG/2ML IJ SOLN
1.0000 mg | INTRAMUSCULAR | Status: DC
  Administered 2020-02-16: 2 mg via INTRAVENOUS
  Filled 2020-02-16: qty 2

## 2020-02-16 MED ORDER — METHOCARBAMOL 500 MG PO TABS: 500.0000 mg | ORAL_TABLET | Freq: Four times a day (QID) | ORAL | Status: DC | PRN

## 2020-02-16 MED ORDER — LACTATED RINGERS IV BOLUS
500.0000 mL | Freq: Once | INTRAVENOUS | Status: AC
  Administered 2020-02-16: 500 mL via INTRAVENOUS

## 2020-02-16 MED ORDER — APIXABAN 2.5 MG PO TABS: 2.5000 mg | ORAL_TABLET | Freq: Two times a day (BID) | ORAL | 0 refills | Status: AC

## 2020-02-16 MED ORDER — ROPIVACAINE HCL 7.5 MG/ML IJ SOLN
INTRAMUSCULAR | Status: DC | PRN
  Administered 2020-02-16: 20 mL via PERINEURAL

## 2020-02-16 MED ORDER — OXYCODONE HCL 5 MG PO TABS
10.0000 mg | ORAL_TABLET | ORAL | Status: DC | PRN
  Administered 2020-02-16: 10 mg via ORAL

## 2020-02-16 MED ORDER — BUPIVACAINE-EPINEPHRINE (PF) 0.25% -1:200000 IJ SOLN
INTRAMUSCULAR | Status: DC | PRN
  Administered 2020-02-16: 30 mL via PERINEURAL

## 2020-02-16 MED ORDER — PROPOFOL 10 MG/ML IV BOLUS
INTRAVENOUS | Status: DC | PRN
  Administered 2020-02-16: 30 mg via INTRAVENOUS

## 2020-02-16 MED ORDER — METHOCARBAMOL 750 MG PO TABS: 750.0000 mg | ORAL_TABLET | Freq: Four times a day (QID) | ORAL | 1 refills | Status: DC | PRN

## 2020-02-16 MED ORDER — PROPOFOL 500 MG/50ML IV EMUL
INTRAVENOUS | Status: DC | PRN
  Administered 2020-02-16: 75 ug/kg/min via INTRAVENOUS

## 2020-02-16 MED ORDER — SODIUM CHLORIDE (PF) 0.9 % IJ SOLN
INTRAMUSCULAR | Status: AC
  Filled 2020-02-16: qty 20

## 2020-02-16 MED ORDER — POVIDONE-IODINE 10 % EX SWAB
2.0000 "application " | Freq: Once | CUTANEOUS | Status: AC
  Administered 2020-02-16: 2 via TOPICAL

## 2020-02-16 MED ORDER — SODIUM CHLORIDE (PF) 0.9 % IJ SOLN
INTRAMUSCULAR | Status: AC
  Filled 2020-02-16: qty 50

## 2020-02-16 MED ORDER — LACTATED RINGERS IV BOLUS: 250.0000 mL | Freq: Once | INTRAVENOUS | Status: DC

## 2020-02-16 MED ORDER — BUPIVACAINE LIPOSOME 1.3 % IJ SUSP
INTRAMUSCULAR | Status: DC | PRN
  Administered 2020-02-16: 20 mL

## 2020-02-16 MED ORDER — TRANEXAMIC ACID-NACL 1000-0.7 MG/100ML-% IV SOLN
1000.0000 mg | INTRAVENOUS | Status: AC
  Administered 2020-02-16: 1000 mg via INTRAVENOUS
  Filled 2020-02-16: qty 100

## 2020-02-16 MED ORDER — OXYCODONE HCL 5 MG PO TABS: 5.0000 mg | ORAL_TABLET | ORAL | Status: DC | PRN

## 2020-02-16 MED ORDER — ONDANSETRON HCL 4 MG/2ML IJ SOLN
INTRAMUSCULAR | Status: AC
  Filled 2020-02-16: qty 2

## 2020-02-16 MED ORDER — PROPOFOL 500 MG/50ML IV EMUL
INTRAVENOUS | Status: AC
  Filled 2020-02-16: qty 50

## 2020-02-16 MED ORDER — CLONIDINE HCL (ANALGESIA) 100 MCG/ML EP SOLN
EPIDURAL | Status: DC | PRN
  Administered 2020-02-16: 50 ug

## 2020-02-16 MED ORDER — DEXAMETHASONE SODIUM PHOSPHATE 10 MG/ML IJ SOLN
INTRAMUSCULAR | Status: AC
  Filled 2020-02-16: qty 1

## 2020-02-16 MED ORDER — WATER FOR IRRIGATION, STERILE IR SOLN
Status: DC | PRN
  Administered 2020-02-16: 1000 mL

## 2020-02-16 MED ORDER — FENTANYL CITRATE (PF) 100 MCG/2ML IJ SOLN: 25.0000 ug | INTRAMUSCULAR | Status: DC | PRN

## 2020-02-16 MED ORDER — BUPIVACAINE-EPINEPHRINE (PF) 0.25% -1:200000 IJ SOLN
INTRAMUSCULAR | Status: AC
  Filled 2020-02-16: qty 30

## 2020-02-16 MED ORDER — 0.9 % SODIUM CHLORIDE (POUR BTL) OPTIME
TOPICAL | Status: DC | PRN
  Administered 2020-02-16: 1000 mL

## 2020-02-16 MED ORDER — PROPOFOL 1000 MG/100ML IV EMUL
INTRAVENOUS | Status: AC
  Filled 2020-02-16: qty 100

## 2020-02-16 MED ORDER — METHOCARBAMOL 500 MG IVPB - SIMPLE MED: 500.0000 mg | Freq: Four times a day (QID) | INTRAVENOUS | Status: DC | PRN

## 2020-02-16 MED ORDER — PHENYLEPHRINE HCL-NACL 10-0.9 MG/250ML-% IV SOLN
INTRAVENOUS | Status: DC | PRN
  Administered 2020-02-16: 35 ug/min via INTRAVENOUS

## 2020-02-16 SURGICAL SUPPLY — 55 items
ATTUNE PS FEM LT SZ 5 CEM KNEE (Femur) ×3 IMPLANT
ATTUNE PSRP INSR SZ5 5 KNEE (Insert) ×2 IMPLANT
ATTUNE PSRP INSR SZ5 5MM KNEE (Insert) ×1 IMPLANT
BAG DECANTER FOR FLEXI CONT (MISCELLANEOUS) ×3 IMPLANT
BAG SPEC THK2 15X12 ZIP CLS (MISCELLANEOUS) ×1
BAG ZIPLOCK 12X15 (MISCELLANEOUS) ×3 IMPLANT
BASE TIBIAL ROT PLAT SZ 5 KNEE (Knees) ×1 IMPLANT
BLADE SAG 18X100X1.27 (BLADE) ×3 IMPLANT
BLADE SAW SGTL 11.0X1.19X90.0M (BLADE) ×3 IMPLANT
BLADE SURG SZ10 CARB STEEL (BLADE) ×6 IMPLANT
BNDG CMPR MED 10X6 ELC LF (GAUZE/BANDAGES/DRESSINGS) ×1
BNDG ELASTIC 6X10 VLCR STRL LF (GAUZE/BANDAGES/DRESSINGS) ×3 IMPLANT
BOWL SMART MIX CTS (DISPOSABLE) ×3 IMPLANT
BSPLAT TIB 5 CMNT ROT PLAT STR (Knees) ×1 IMPLANT
CEMENT HV SMART SET (Cement) ×6 IMPLANT
COVER SURGICAL LIGHT HANDLE (MISCELLANEOUS) ×3 IMPLANT
COVER WAND RF STERILE (DRAPES) IMPLANT
CUFF TOURN SGL QUICK 34 (TOURNIQUET CUFF) ×3
CUFF TRNQT CYL 34X4.125X (TOURNIQUET CUFF) ×1 IMPLANT
DECANTER SPIKE VIAL GLASS SM (MISCELLANEOUS) ×9 IMPLANT
DRAPE ORTHO SPLIT 77X108 STRL (DRAPES)
DRAPE SURG ORHT 6 SPLT 77X108 (DRAPES) IMPLANT
DRAPE U-SHAPE 47X51 STRL (DRAPES) ×3 IMPLANT
DRSG AQUACEL AG ADV 3.5X10 (GAUZE/BANDAGES/DRESSINGS) ×3 IMPLANT
DURAPREP 26ML APPLICATOR (WOUND CARE) ×3 IMPLANT
ELECT REM PT RETURN 15FT ADLT (MISCELLANEOUS) ×3 IMPLANT
GLOVE BIO SURGEON STRL SZ7.5 (GLOVE) ×3 IMPLANT
GLOVE BIO SURGEON STRL SZ8.5 (GLOVE) ×3 IMPLANT
GLOVE BIOGEL PI IND STRL 8 (GLOVE) ×1 IMPLANT
GLOVE BIOGEL PI IND STRL 9 (GLOVE) ×1 IMPLANT
GLOVE BIOGEL PI INDICATOR 8 (GLOVE) ×2
GLOVE BIOGEL PI INDICATOR 9 (GLOVE) ×2
GOWN STRL REUS W/TWL XL LVL3 (GOWN DISPOSABLE) ×6 IMPLANT
HANDPIECE INTERPULSE COAX TIP (DISPOSABLE) ×3
HOOD PEEL AWAY FLYTE STAYCOOL (MISCELLANEOUS) ×9 IMPLANT
KIT TURNOVER KIT A (KITS) IMPLANT
NEEDLE HYPO 21X1.5 SAFETY (NEEDLE) ×6 IMPLANT
NS IRRIG 1000ML POUR BTL (IV SOLUTION) ×3 IMPLANT
PACK ICE MAXI GEL EZY WRAP (MISCELLANEOUS) ×3 IMPLANT
PACK TOTAL KNEE CUSTOM (KITS) ×3 IMPLANT
PATELLA MEDIAL ATTUN 35MM KNEE (Knees) ×3 IMPLANT
PENCIL SMOKE EVACUATOR (MISCELLANEOUS) IMPLANT
PIN DRILL FIX HALF THREAD (BIT) ×3 IMPLANT
PIN STEINMAN FIXATION KNEE (PIN) ×3 IMPLANT
PROTECTOR NERVE ULNAR (MISCELLANEOUS) ×3 IMPLANT
SET HNDPC FAN SPRY TIP SCT (DISPOSABLE) ×1 IMPLANT
SUT VIC AB 1 CTX 36 (SUTURE) ×3
SUT VIC AB 1 CTX36XBRD ANBCTR (SUTURE) ×1 IMPLANT
SUT VIC AB 3-0 CT1 27 (SUTURE) ×9
SUT VIC AB 3-0 CT1 TAPERPNT 27 (SUTURE) ×3 IMPLANT
SYR CONTROL 10ML LL (SYRINGE) ×6 IMPLANT
TIBIAL BASE ROT PLAT SZ 5 KNEE (Knees) ×3 IMPLANT
TRAY FOLEY MTR SLVR 16FR STAT (SET/KITS/TRAYS/PACK) ×3 IMPLANT
WATER STERILE IRR 1000ML POUR (IV SOLUTION) ×6 IMPLANT
WRAP KNEE MAXI GEL POST OP (GAUZE/BANDAGES/DRESSINGS) ×3 IMPLANT

## 2020-02-16 NOTE — Op Note (Signed)
PATIENT ID:      Tanya Kelly  MRN:     [id number] DOB/AGE:    1965/05/12 / 54 y.o.       OPERATIVE REPORT   DATE OF PROCEDURE:  02/16/2020      PREOPERATIVE DIAGNOSIS:   LEFT KNEE OSTEOARTHRITIS      Estimated body mass index is 37.98 kg/m as calculated from the following:   Height as of this encounter: 5\' 1"  (1.549 m).   Weight as of this encounter: 91.2 kg.                                                       POSTOPERATIVE DIAGNOSIS:   Same                                                                  PROCEDURE:  Procedure(s): LEFT TOTAL KNEE ARTHROPLASTY Using DepuyAttune RP implants #5L Femur, #5Tibia, 5 mm Attune RP bearing, 35 Patella    SURGEON:  ASSISTANT:   Nestor Lewandowsky. Tomi Likens   (Present and scrubbed throughout the case, critical for assistance with exposure, retraction, instrumentation, and closure.)        ANESTHESIA: Spinal, 20cc Exparel, 50cc 0.25% Marcaine EBL: 300 cc FLUID REPLACEMENT: 1500 cc crystaloid TOURNIQUET: DRAINS: None TRANEXAMIC ACID: 1gm IV, 2gm topical COMPLICATIONS:  None         INDICATIONS FOR PROCEDURE: The patient has  LEFT KNEE OSTEOARTHRITIS, Var deformities, XR shows bone on bone arthritis, lateral subluxation of tibia. Patient has failed all conservative measures including anti-inflammatory medicines, narcotics, attempts at exercise and weight loss, cortisone injections and viscosupplementation.  Risks and benefits of surgery have been discussed, questions answered.   DESCRIPTION OF PROCEDURE: The patient identified by armband, received  IV antibiotics, in the holding area at [facility]. Patient taken to the operating room, appropriate anesthetic monitors were attached, and Spinal anesthesia was  induced. IV Tranexamic acid was given.Tourniquet applied high to the operative thigh. Lateral post and foot positioner applied to the table, the lower extremity was then prepped and draped in usual sterile fashion from the  toes to the tourniquet. Time-out procedure was performed. [facility]. [facility] PAC, was present and scrubbed throughout the case, critical for assistance with, positioning, exposure, retraction, instrumentation, and closure.The skin and subcutaneous tissue along the incision was injected with 20 cc of a mixture of Exparel and Marcaine solution, using a 20-gauge by 1-1/2 inch needle. We began the operation, with the knee flexed 130 degrees, by making the anterior midline incision starting at handbreadth above the patella going over the patella 1 cm medial to and 4 cm distal to the tibial tubercle. Small bleeders in the skin and the subcutaneous tissue identified and cauterized. Transverse retinaculum was incised and reflected medially and a medial parapatellar arthrotomy was accomplished. the patella was everted and theprepatellar fat pad resected. The superficial medial collateral ligament was then elevated from anterior to posterior along the proximal flare of the tibia and anterior half of the menisci resected. The knee was hyperflexed exposing bone on bone arthritis. Peripheral and  notch osteophytes as well as the cruciate ligaments were then resected. We continued to work our way around posteriorly along the proximal tibia, and externally rotated the tibia subluxing it out from underneath the femur. A McHale PCL retractor was placed through the notch and a lateral Hohmann retractor placed, and we then entered the proximal tibia in line with the Depuy starter drill in line with the axis of the tibia followed by an intramedullary guide rod and 0-degree posterior slope cutting guide. The tibial cutting guide, 4 degree posterior sloped, was pinned into place allowing resection of 3 mm of bone medially and 10 mm of bone laterally. Satisfied with the tibial resection, we then entered the distal femur 2 mm anterior to the PCL origin with the intramedullary guide rod and applied the distal femoral cutting guide set at 9 mm,  with 5 degrees of valgus. This was pinned along the epicondylar axis. At this point, the distal femoral cut was accomplished without difficulty. We then sized for a #5L femoral component and pinned the guide in 3 degrees of external rotation. The chamfer cutting guide was pinned into place. The anterior, posterior, and chamfer cuts were accomplished without difficulty followed by the Attune RP box cutting guide and the box cut. We also removed posterior osteophytes from the posterior femoral condyles. The posterior capsule was injected with Exparel solution. The knee was brought into full extension. We checked our extension gap and fit a 5 mm bearing. Distracting in extension with a lamina spreader,  bleeders in the posterior capsule, Posterior medial and posterior lateral gutter were cauterized.  The transexamic acid-soaked sponge was then placed in the gap of the knee in extension. The knee was flexed 30. The posterior patella cut was accomplished with the 9.5 mm Attune cutting guide, sized for a 40mm dome, and the fixation pegs drilled.The knee was then once again hyperflexed exposing the proximal tibia. We sized for a # 5 tibial base plate, applied the smokestack and the conical reamer followed by the the Delta fin keel punch. We then hammered into place the Attune RP trial femoral component, drilled the lugs, inserted a  5 mm trial bearing, trial patellar button, and took the knee through range of motion from 0-130 degrees. Medial and lateral ligamentous stability was checked. No thumb pressure was required for patellar Tracking. The tourniquet was not used. All trial components were removed, mating surfaces irrigated with pulse lavage, and dried with suction and sponges. 10 cc of the Exparel solution was applied to the cancellus bone of the patella distal femur and proximal tibia.  After waiting 30 seconds, the bony surfaces were again, dried with sponges. A double batch of DePuy HV cement was mixed and  applied to all bony metallic mating surfaces except for the posterior condyles of the femur itself. In order, we hammered into place the tibial tray and removed excess cement, the femoral component and removed excess cement. The final Attune RP bearing was inserted, and the knee brought to full extension with compression. The patellar button was clamped into place, and excess cement removed. The knee was held at 30 flexion with compression, while the cement cured. The wound was irrigated out with normal saline solution pulse lavage. The rest of the Exparel was injected into the parapatellar arthrotomy, subcutaneous tissues, and periosteal tissues. The parapatellar arthrotomy was closed with running #1 Vicryl suture. The subcutaneous tissue with 3-0 undyed Vicryl suture, and the skin with running 3-0 SQ vicryl. An Aquacil and Ace  wrap were applied. The patient was taken to recovery room without difficulty.   Nestor Lewandowsky 02/16/2020, 10:27 AM

## 2020-02-16 NOTE — Progress Notes (Signed)
AssistedDr. Turk with left, ultrasound guided, adductor canal block. Side rails up, monitors on throughout procedure. See vital signs in flow sheet. Tolerated Procedure well.  

## 2020-02-16 NOTE — Anesthesia Procedure Notes (Signed)
Spinal  Patient location during procedure: OR Start time: 02/16/2020 11:48 AM End time: 02/16/2020 11:53 AM Staffing Performed: anesthesiologist  Anesthesiologist: Cecile Hearing, MD Preanesthetic Checklist Completed: patient identified, IV checked, risks and benefits discussed, surgical consent, monitors and equipment checked, pre-op evaluation and timeout performed Spinal Block Patient position: sitting Prep: DuraPrep and site prepped and draped Patient monitoring: continuous pulse ox and blood pressure Approach: midline Location: L3-4 Injection technique: single-shot Needle Needle type: Pencan  Needle gauge: 24 G Additional Notes Functioning IV was confirmed and monitors were applied. Sterile prep and drape, including hand hygiene, mask and sterile gloves were used. The patient was positioned and the spine was prepped. The skin was anesthetized with lidocaine.  Free flow of clear CSF was obtained prior to injecting local anesthetic into the CSF.  The spinal needle aspirated freely following injection.  The needle was carefully withdrawn.  The patient tolerated the procedure well. Consent was obtained prior to procedure with all questions answered and concerns addressed. Risks including but not limited to bleeding, infection, nerve damage, paralysis, failed block, inadequate analgesia, allergic reaction, high spinal, itching and headache were discussed and the patient wished to proceed.   Tanya Aran, MD

## 2020-02-16 NOTE — Transfer of Care (Signed)
Immediate Anesthesia Transfer of Care Note  Patient: Tanya Kelly  Procedure(s) Performed: LEFT TOTAL KNEE ARTHROPLASTY (Left Knee)  Patient Location: PACU  Anesthesia Type:Spinal  Level of Consciousness: awake, alert , oriented and patient cooperative  Airway & Oxygen Therapy: Patient Spontanous Breathing and Patient connected to face mask oxygen  Post-op Assessment: Report given to RN and Post -op Vital signs reviewed and stable  Post vital signs: Reviewed and stable  Last Vitals:  Vitals Value Taken Time  BP    Temp    Pulse 79 02/16/20 1350  Resp 22 02/16/20 1350  SpO2 96 % 02/16/20 1350  Vitals shown include unvalidated device data.  Last Pain:  Vitals:   02/16/20 0951  TempSrc:   PainSc: 0-No pain         Complications: No complications documented.

## 2020-02-16 NOTE — Progress Notes (Signed)
Orthopedic Tech Progress Note Patient Details:  Tanya Kelly 05/06/1966 [id number] Bone foam  Ortho Devices Type of Ortho Device: Bone foam zero knee Ortho Device/Splint Interventions: Ordered   Post Interventions Instructions Provided: Care of device   Jennye Moccasin 02/16/2020, 2:11 PM

## 2020-02-16 NOTE — Progress Notes (Signed)
Orthopedic Tech Progress Note Patient Details:  Tanya Kelly 05-23-1965 [id number]  Ortho Devices Type of Ortho Device: Knee Immobilizer Ortho Device/Splint Interventions: Ordered   Post Interventions Instructions Provided: Care of device   Saul Fordyce 02/16/2020, 5:17 PM

## 2020-02-16 NOTE — Anesthesia Procedure Notes (Signed)
Anesthesia Regional Block: Adductor canal block   Pre-Anesthetic Checklist: ,, timeout performed, Correct Patient, Correct Site, Correct Laterality, Correct Procedure, Correct Position, site marked, Risks and benefits discussed,  Surgical consent,  Pre-op evaluation,  At surgeon's request and post-op pain management  Laterality: Left  Prep: chloraprep       Needles:  Injection technique: Single-shot  Needle Type: Echogenic Needle     Needle Length: 9cm  Needle Gauge: 21     Additional Needles:   Procedures:,,,, ultrasound used (permanent image in chart),,,,  Narrative:  Start time: 02/16/2020 10:47 AM End time: 02/16/2020 10:52 AM Injection made incrementally with aspirations every 5 mL.  Performed by: Personally  Anesthesiologist: Cecile Hearing, MD  Additional Notes: No pain on injection. No increased resistance to injection. Injection made in 5cc increments.  Good needle visualization.  Patient tolerated procedure well.

## 2020-02-16 NOTE — Evaluation (Signed)
Physical Therapy Evaluation Patient Details Name: Tanya Kelly MRN: [id number] DOB: 1966/01/06 Today's Date: 02/16/2020   History of Present Illness  pt s/p LTKA 02/16/2020  Clinical Impression  Pt s/p TKA for SDDC from PACU. This first session accomplished education of HEP, knee precautions, bed mobility, and tranfers. Amulation and education with safety with RW, however pt did have a lot of knee buckling even with the KI on at this time. Mobility and ambulation limited , will have patient rest a little and will recheck ability to ambulate and perform steps.     Follow Up Recommendations Follow surgeon's recommendation for DC plan and follow-up therapies (pt states MD was going to set up HHPT then to OPPT, told patient to f/u with MD office to inquire.)    Equipment Recommendations  Rolling walker with 5" wheels    Recommendations for Other Services       Precautions / Restrictions Precautions Precautions: Knee Precaution Booklet Issued: No Precaution Comments: educated with proper postioning and use of bone foam Required Braces or Orthoses: Knee Immobilizer - Left (educated with KI for only today and tomorrow when ambulating until L knee feeling and strength returns . Pt understood KI is only due to DC SDDC and to not use in bed or while resting.)      Mobility  Bed Mobility Overal bed mobility: Modified Independent Bed Mobility: Supine to Sit              Transfers Overall transfer level: Needs assistance Equipment used: Rolling walker (2 wheeled) Transfers: Sit to/from Stand Sit to Stand: Min assist         General transfer comment: cues for RW and hand safety for placement.  Ambulation/Gait Ambulation/Gait assistance: Mod assist;+2 safety/equipment Gait Distance (Feet): 5 Feet Assistive device: Rolling walker (2 wheeled) Gait Pattern/deviations: Step-to pattern     General Gait Details: limited distance this session due to buckling even with KI in  place. So performed very short distance and then sat back down in recliner.  Stairs            Wheelchair Mobility    Modified Rankin (Stroke Patients Only)       Balance                                             Pertinent Vitals/Pain Pain Assessment: 0-10 Pain Score: 6  Pain Location: L knee Pain Descriptors / Indicators: Aching;Sore Pain Intervention(s): Limited activity within patient's tolerance;Monitored during session (educated about ice as a therapy for pain and swelling)    Home Living Family/patient expects to be discharged to:: Private residence Living Arrangements: Spouse/significant other Available Help at Discharge: Friend(s) Type of Home: House Home Access: Stairs to enter Entrance Stairs-Rails: Right;Left (can only reach one) Entrance Stairs-Number of Steps: 3 Home Layout: One level Home Equipment: Crutches      Prior Function Level of Independence: Independent         Comments: pt works however injuried her knee at work and has been hard to do daily activities like before     Hand Dominance        Extremity/Trunk Assessment        Lower Extremity Assessment Lower Extremity Assessment: LLE deficits/detail LLE Deficits / Details: grossly 0-60 s/p surgery within pain tolerance and bandage. not able to perform full SLR independent therefore KI applied today for  safety with ambulation       Communication   Communication: No difficulties  Cognition Arousal/Alertness: Awake/alert Behavior During Therapy: WFL for tasks assessed/performed Overall Cognitive Status: Within Functional Limits for tasks assessed                                        General Comments      Exercises Total Joint Exercises Ankle Circles/Pumps: AROM;Left;10 reps;Supine Quad Sets: AROM;Left;10 reps;Supine Short Arc Quad: AAROM;Left;Supine;5 reps Heel Slides: AAROM;Left;5 reps;Supine Hip ABduction/ADduction: AAROM;Left;5  reps;Supine Straight Leg Raises: AAROM;Left;5 reps;Supine Goniometric ROM: 0-60   Assessment/Plan    PT Assessment Patient needs continued PT services  PT Problem List Decreased strength;Decreased range of motion;Decreased activity tolerance;Decreased mobility       PT Treatment Interventions DME instruction;Stair training;Functional mobility training;Therapeutic activities;Therapeutic exercise;Patient/family education    PT Goals (Current goals can be found in the Care Plan section)  Acute Rehab PT Goals Patient Stated Goal: I want to be able to take care of my grandbabies PT Goal Formulation: With patient Time For Goal Achievement: 02/17/20 Potential to Achieve Goals: Good    Frequency 7X/week   Barriers to discharge        Co-evaluation               AM-PAC PT "6 Clicks" Mobility  Outcome Measure Help needed turning from your back to your side while in a flat bed without using bedrails?: None Help needed moving from lying on your back to sitting on the side of a flat bed without using bedrails?: None Help needed moving to and from a bed to a chair (including a wheelchair)?: A Little Help needed standing up from a chair using your arms (e.g., wheelchair or bedside chair)?: A Little Help needed to walk in hospital room?: A Lot Help needed climbing 3-5 steps with a railing? : A Lot 6 Click Score: 18    End of Session Equipment Utilized During Treatment: Gait belt Activity Tolerance: Patient tolerated treatment well Patient left: in chair;with nursing/sitter in room Nurse Communication: Mobility status PT Visit Diagnosis: Other abnormalities of gait and mobility (R26.89)    Time: 4034-7425 PT Time Calculation (min) (ACUTE ONLY): 40 min   Charges:     PT Treatments $Gait Training: 8-22 mins        Tanya Kelly, PT, MPT Acute Rehabilitation Services Office: [phone] Pager: [phone] 02/16/2020   Marella Bile 02/16/2020, 8:47 PM

## 2020-02-16 NOTE — Discharge Instructions (Signed)

## 2020-02-16 NOTE — Progress Notes (Signed)
Physical Therapy Treatment Patient Details Name: Tanya Kelly MRN: [id number] DOB: 04/07/1966 Today's Date: 02/16/2020    History of Present Illness pt s/p LTKA 02/16/2020    PT Comments    Pt's significant other there for this session. Still had some lag with initial steps with RW, however then able to understand to utilize UE to help support herself while stepping with the LLE. Caregiver was involved and educated during session with ambulation, safety measure, KI wear , stair training 2xs, and progression with exercises. We also discussed and demonstrated how to get into a trunk with a running board and how to get into a high bed. PT and caregiver feels comfortable about going home and what they have learned and demonstrated. Safe to DC home with pt's caregiver holding to gait belt while pt up tonight and tomorrow.    Follow Up Recommendations  Follow surgeon's recommendation for DC plan and follow-up therapies     Equipment Recommendations  Rolling walker with 5" wheels    Recommendations for Other Services       Precautions / Restrictions Precautions Precautions: Knee Precaution Booklet Issued: No Precaution Comments: educated with proper postioning and use of bone foam Required Braces or Orthoses: Knee Immobilizer - Left (educated with [place] for only today and tomorrow when ambulating until L knee feeling and strength returns . Pt understood KI is only due to DC SDDC and to not use in bed or while resting.)    Mobility  Bed Mobility Overal bed mobility: Modified Independent Bed Mobility: Supine to Sit              Transfers Overall transfer level: Needs assistance Equipment used: Rolling walker (2 wheeled) Transfers: Sit to/from Stand Sit to Stand: Supervision         General transfer comment: cues for RW and hand safety for placement.  Ambulation/Gait Ambulation/Gait assistance: Min guard Gait Distance (Feet): 75 Feet Assistive device: Rolling walker (2  wheeled) Gait Pattern/deviations: Step-to pattern     General Gait Details: PT's significant other there duering this session to learn with gait belt and safety training. Pt ambulated better this session ustilizing UEs to support when stepping with LLE.   Stairs Stairs: Yes Stairs assistance: Min guard Stair Management: One rail Right;Forwards;With crutches Number of Stairs: 3 General stair comments: completed 2x with significant other assisting , handout given and pt tolerated well. PT assured family will be there to help as well.   Wheelchair Mobility    Modified Rankin (Stroke Patients Only)       Balance                                            Cognition Arousal/Alertness: Awake/alert Behavior During Therapy: WFL for tasks assessed/performed Overall Cognitive Status: Within Functional Limits for tasks assessed                                        Exercises    General Comments        Pertinent Vitals/Pain Pain Assessment: 0-10 Pain Score: 5  Pain Location: L knee Pain Descriptors / Indicators: Aching;Sore Pain Intervention(s): Limited activity within patient's tolerance;Monitored during session (educated about ice as a therapy for pain and swelling)    Home Living Family/patient expects to be discharged  to:: Private residence Living Arrangements: Spouse/significant other Available Help at Discharge: Friend(s) Type of Home: House Home Access: Stairs to enter Entrance Stairs-Rails: Right;Left (can only reach one) Home Layout: One level Home Equipment: Crutches      Prior Function Level of Independence: Independent      Comments: pt works however injuried her knee at work and has been hard to do daily activities like before   PT Goals (current goals can now be found in the care plan section) Acute Rehab PT Goals Patient Stated Goal: I want to be able to take care of my grandbabies PT Goal Formulation: With  patient Time For Goal Achievement: 02/17/20 Potential to Achieve Goals: Good Progress towards PT goals: Progressing toward goals;Goals met and updated - see care plan    Frequency    7X/week      PT Plan Current plan remains appropriate    Co-evaluation              AM-PAC PT "6 Clicks" Mobility   Outcome Measure  Help needed turning from your back to your side while in a flat bed without using bedrails?: None Help needed moving from lying on your back to sitting on the side of a flat bed without using bedrails?: None Help needed moving to and from a bed to a chair (including a wheelchair)?: A Little Help needed standing up from a chair using your arms (e.g., wheelchair or bedside chair)?: A Little Help needed to walk in hospital room?: A Little Help needed climbing 3-5 steps with a railing? : A Little 6 Click Score: 20    End of Session Equipment Utilized During Treatment: Gait belt Activity Tolerance: Patient tolerated treatment well Patient left: in chair;with nursing/sitter in room Nurse Communication: Mobility status PT Visit Diagnosis: Other abnormalities of gait and mobility (R26.89)     Time: 5176-1607 PT Time Calculation (min) (ACUTE ONLY): 25 min  Charges:  $Gait Training: 23-37 mins                     Markus Casten, PT, MPT Acute Rehabilitation Services Office: [phone] Pager: [phone] 02/16/2020    Clide Dales 02/16/2020, 8:56 PM

## 2020-02-16 NOTE — Interval H&P Note (Signed)
History and Physical Interval Note:  02/16/2020 10:26 AM  Tanya Kelly  has presented today for surgery, with the diagnosis of LEFT KNEE OSTEOARTHRITIS.  The various methods of treatment have been discussed with the patient and family. After consideration of risks, benefits and other options for treatment, the patient has consented to  Procedure(s): LEFT TOTAL KNEE ARTHROPLASTY (Left) as a surgical intervention.  The patient's history has been reviewed, patient examined, no change in status, stable for surgery.  I have reviewed the patient's chart and labs.  Questions were answered to the patient's satisfaction.     Nestor Lewandowsky

## 2020-02-17 ENCOUNTER — Encounter (HOSPITAL_COMMUNITY): Payer: Self-pay | Admitting: Orthopedic Surgery

## 2020-02-17 NOTE — Anesthesia Postprocedure Evaluation (Signed)
Anesthesia Post Note  Patient: Tanya Kelly  Procedure(s) Performed: LEFT TOTAL KNEE ARTHROPLASTY (Left Knee)     Patient location during evaluation: PACU Anesthesia Type: Spinal Level of consciousness: oriented and awake and alert Pain management: pain level controlled Vital Signs Assessment: post-procedure vital signs reviewed and stable Respiratory status: spontaneous breathing, respiratory function stable and patient connected to nasal cannula oxygen Cardiovascular status: blood pressure returned to baseline and stable Postop Assessment: no headache, no backache, no apparent nausea or vomiting and spinal receding Anesthetic complications: no   No complications documented.               Shelton Silvas

## 2020-02-21 ENCOUNTER — Encounter (HOSPITAL_BASED_OUTPATIENT_CLINIC_OR_DEPARTMENT_OTHER): Payer: Self-pay | Admitting: Emergency Medicine

## 2020-02-21 ENCOUNTER — Emergency Department (HOSPITAL_BASED_OUTPATIENT_CLINIC_OR_DEPARTMENT_OTHER): Payer: BC Managed Care – PPO

## 2020-02-21 ENCOUNTER — Other Ambulatory Visit: Payer: Self-pay

## 2020-02-21 ENCOUNTER — Emergency Department (HOSPITAL_BASED_OUTPATIENT_CLINIC_OR_DEPARTMENT_OTHER)
Admission: EM | Admit: 2020-02-21 | Discharge: 2020-02-21 | Disposition: A | Discharge status: Home / Self Care | Payer: BC Managed Care – PPO | Attending: Emergency Medicine | Admitting: Emergency Medicine

## 2020-02-21 DIAGNOSIS — I10 Essential (primary) hypertension: Secondary | ICD-10-CM | POA: Diagnosis not present

## 2020-02-21 DIAGNOSIS — Z7901 Long term (current) use of anticoagulants: Secondary | ICD-10-CM | POA: Insufficient documentation

## 2020-02-21 DIAGNOSIS — Z96652 Presence of left artificial knee joint: Secondary | ICD-10-CM | POA: Insufficient documentation

## 2020-02-21 DIAGNOSIS — M79605 Pain in left leg: Secondary | ICD-10-CM | POA: Diagnosis not present

## 2020-02-21 DIAGNOSIS — R609 Edema, unspecified: Secondary | ICD-10-CM | POA: Diagnosis not present

## 2020-02-21 DIAGNOSIS — Z79899 Other long term (current) drug therapy: Secondary | ICD-10-CM | POA: Diagnosis not present

## 2020-02-21 DIAGNOSIS — M7989 Other specified soft tissue disorders: Secondary | ICD-10-CM | POA: Diagnosis not present

## 2020-02-21 DIAGNOSIS — R600 Localized edema: Secondary | ICD-10-CM | POA: Diagnosis not present

## 2020-02-21 NOTE — ED Notes (Signed)
Patient transported to Ultrasound 

## 2020-02-21 NOTE — ED Notes (Signed)
ED MD ok'ed taking home pain meds, took Dilaudid 2mg  po, not driving home

## 2020-02-21 NOTE — Discharge Instructions (Signed)
Your [place] is negative for a blood clot.  Follow up with your ortho doc.

## 2020-02-21 NOTE — ED Provider Notes (Signed)
[facility] EMERGENCY DEPARTMENT Provider Note   CSN: [id number] Arrival date & time: 02/21/20  1455     History Chief Complaint  Patient presents with  . Leg Swelling    Tanya Kelly is a 54 y.o. female.  54 yo F with a chief complaints of left leg swelling.  The patient had a total knee replacement done just about a week ago.  Had messaged her orthopedic surgeon who told her she need to go to the ED to be evaluated for a DVT.  She is on Eliquis.  She denies any chest pain or trouble breathing.  Has had some mild pain to the knee post surgery.  No fevers.  No drainage.  The history is provided by the patient.  Leg Pain Location:  Leg Time since incident:  6 days Injury: yes   Mechanism of injury comment:  Surgery Leg location:  L leg Pain details:    Quality:  Aching   Radiates to:  Does not radiate   Severity:  Mild   Onset quality:  Gradual   Duration:  6 days   Timing:  Constant   Progression:  Worsening Chronicity:  New Dislocation: no   Prior injury to area:  No Relieved by:  Nothing Worsened by:  Bearing weight and exercise Ineffective treatments:  None tried Associated symptoms: no fever        Past Medical History:  Diagnosis Date  . Anginal pain (HCC)   . Arthritis   . Dyspnea   . Dysrhythmia    A-fib  . Hypertension     Patient Active Problem List   Diagnosis Date Noted  . Degenerative arthritis of left knee 02/13/2020  . Right knee pain 06/15/2017  . Onychomycosis due to dermatophyte 04/09/2017  . Tinea pedis of both feet 04/09/2017  . 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 Fuchs' corneal dystrophy 12/27/2015  . Leg pain 12/27/2015  . Macular retinal cyst of left eye 12/27/2015  . Migraine headache 12/27/2015  . Nuclear sclerotic cataract of both eyes 12/27/2015    Past Surgical History:  Procedure Laterality Date  . BREAST BIOPSY  01/2020  . CESAREAN SECTION    . TOTAL KNEE ARTHROPLASTY Left 02/16/2020    Procedure: LEFT TOTAL KNEE ARTHROPLASTY;  Surgeon: Gean Birchwood, MD;  Location: [facility] ORS;  Service: Orthopedics;  Laterality: Left;  Marland Kitchen VARICOSE VEIN SURGERY       OB History   No obstetric history on file.     No family history on file.  Social History   Tobacco Use  . Smoking status: Never Smoker  . Smokeless tobacco: Never Used  Vaping Use  . Vaping Use: Never used  Substance Use Topics  . Alcohol use: Yes    Comment: socially  . Drug use: No    Home Medications Prior to Admission medications   Medication Sig Start Date End Date Taking? Authorizing Provider  amLODipine (NORVASC) 5 MG tablet Take 1 tablet (5 mg total) by mouth daily. 08/20/18   Mesner, Barbara Cower, MD  apixaban (ELIQUIS) 2.5 MG TABS tablet Take 1 tablet (2.5 mg total) by mouth 2 (two) times daily. 02/16/20   Allena Katz, PA-C  atorvastatin (LIPITOR) 20 MG tablet Take 20 mg by mouth daily. 01/08/20   [provider]  cholecalciferol (VITAMIN D3) 25 MCG (1000 UNIT) tablet Take 1,000 Units by mouth daily.    [provider]  Dapsone 5 % topical gel Apply 1 application topically in the  morning.  06/08/18   [provider]  gabapentin (NEURONTIN) 100 MG capsule Take 100 mg by mouth in the morning, at noon, and at bedtime. 01/08/20   [provider]  HYDROmorphone (DILAUDID) 2 MG tablet Take 1 tablet (2 mg total) by mouth every 4 (four) hours as needed for severe pain. 02/16/20   Allena Katz, PA-C  methocarbamol (ROBAXIN) 750 MG tablet Take 1 tablet (750 mg total) by mouth every 6 (six) hours as needed for muscle spasms. 02/16/20   Allena Katz, PA-C  metoprolol succinate (TOPROL-XL) 25 MG 24 hr tablet Take 25 mg by mouth daily.  12/15/19   [provider]  PREVIDENT 5000 SENSITIVE 1.1-5 % PSTE Place 1 application onto teeth every other day.  08/10/18   [provider]  SULFACLEANSE 8/4 8-4 % SUSP Apply 1 application topically in the morning.  07/01/18   [provider]    Allergies    Codeine  Review of Systems   Review of Systems  Constitutional: Negative for chills and fever.  HENT: Negative for congestion and rhinorrhea.   Eyes: Negative for redness and visual disturbance.  Respiratory: Negative for shortness of breath and wheezing.   Cardiovascular: Positive for leg swelling. Negative for chest pain and palpitations.  Gastrointestinal: Negative for nausea and vomiting.  Genitourinary: Negative for dysuria and urgency.  Musculoskeletal: Positive for arthralgias. Negative for myalgias.  Skin: Negative for pallor and wound.  Neurological: Negative for dizziness and headaches.    Physical Exam Updated Vital Signs BP 125/71 (BP Location: Right Arm)   Pulse 86   Temp 98.5 F (36.9 C) (Oral)   Resp 18   Ht 5\' 1"  (1.549 m)   Wt 90.7 kg   SpO2 98%   BMI 37.79 kg/m   Physical Exam Vitals and nursing note reviewed.  Constitutional:      General: She is not in acute distress.    Appearance: She is well-developed. She is not diaphoretic.  HENT:     Head: Normocephalic and atraumatic.  Eyes:     Pupils: Pupils are equal, round, and reactive to light.  Cardiovascular:     Rate and Rhythm: Normal rate and regular rhythm.     Heart sounds: No murmur heard.  No friction rub. No gallop.   Pulmonary:     Effort: Pulmonary effort is normal.     Breath sounds: No wheezing or rales.  Abdominal:     General: There is no distension.     Palpations: Abdomen is soft.     Tenderness: There is no abdominal tenderness.  Musculoskeletal:        General: Swelling and tenderness present.     Cervical back: Normal range of motion and neck supple.     Comments: Mild swelling to the left lower extremity.  Pulse motor and sensation are intact distally.  No drainage noted through the Mepilex dressing.  Skin:    General: Skin is warm and dry.  Neurological:     Mental Status: She is alert and oriented to person, place, and time.    Psychiatric:        Behavior: Behavior normal.     ED Results / Procedures / Treatments   Labs (all labs ordered are listed, but only abnormal results are displayed) Labs Reviewed - No data to display  EKG None  Radiology Venous Img Lower Unilateral Left  Result Date: 02/21/2020 CLINICAL DATA:  Left leg pain, swelling EXAM: LEFT LOWER  EXTREMITY VENOUS DOPPLER ULTRASOUND TECHNIQUE: Gray-scale sonography with compression, as well as color and duplex ultrasound, were performed to evaluate the deep venous system(s) from the level of the common femoral vein through the popliteal and proximal calf veins. COMPARISON:  None. FINDINGS: VENOUS Normal compressibility of the common femoral, superficial femoral, and popliteal veins, as well as the visualized calf veins. Visualized portions of profunda femoral vein and great saphenous vein unremarkable. No filling defects to suggest DVT on grayscale or color Doppler imaging. Doppler waveforms show normal direction of venous flow, normal respiratory plasticity and response to augmentation. Limited views of the contralateral common femoral vein are unremarkable. OTHER None. Limitations: none IMPRESSION: Negative. Electronically Signed   By: Charlett Nose M.D.   On: 02/21/2020 18:07    Procedures Procedures (including critical care time)  Medications Ordered in ED Medications - No data to display  ED Course  I have reviewed the triage vital signs and the nursing notes.  Pertinent labs & imaging results that were available during my care of the patient were reviewed by me and considered in my medical decision making (see chart for details).    MDM Rules/Calculators/A&P                          54 yo F with a chief complaints of left leg pain and swelling.  She is 5 days postop from her left knee surgery.  Mild swelling noted to that leg.  Was sent from her orthopedic office to evaluate for DVT.  Will obtain an ultrasound.  It is not warm there  is no noted drainage she has not had fevers I feel infection is less likely.  DVT study is negative.  We will have the patient follow-up with an orthopedist in the office.  6:24 PM:  I have discussed the diagnosis/risks/treatment options with the patient and believe the pt to be eligible for discharge home to follow-up with Ortho. We also discussed returning to the ED immediately if new or worsening sx occur. We discussed the sx which are most concerning (e.g., sudden worsening pain, fever, inability to tolerate by mouth) that necessitate immediate return. Medications administered to the patient during their visit and any new prescriptions provided to the patient are listed below.  Medications given during this visit Medications - No data to display   The patient appears reasonably screen and/or stabilized for discharge and I doubt any other medical condition or other [facility] requiring further screening, evaluation, or treatment in the ED at this time prior to discharge.   Final Clinical Impression(s) / ED Diagnoses Final diagnoses:  Peripheral edema    Rx / DC Orders ED Discharge Orders    None       Melene Plan, DO 02/21/20 1824

## 2020-02-21 NOTE — ED Triage Notes (Signed)
Reports having total knee on Monday.  Now having swelling in that foot.  Surgeon told her to come get checked for blood clot.

## 2020-02-26 DIAGNOSIS — H33321 Round hole, right eye: Secondary | ICD-10-CM | POA: Diagnosis not present

## 2020-04-13 DIAGNOSIS — R197 Diarrhea, unspecified: Secondary | ICD-10-CM | POA: Diagnosis not present

## 2020-04-13 DIAGNOSIS — R079 Chest pain, unspecified: Secondary | ICD-10-CM | POA: Diagnosis not present

## 2020-04-13 DIAGNOSIS — R002 Palpitations: Secondary | ICD-10-CM | POA: Diagnosis not present

## 2020-04-13 DIAGNOSIS — I4891 Unspecified atrial fibrillation: Secondary | ICD-10-CM | POA: Diagnosis not present

## 2020-04-13 DIAGNOSIS — I517 Cardiomegaly: Secondary | ICD-10-CM | POA: Diagnosis not present

## 2020-04-13 DIAGNOSIS — R059 Cough, unspecified: Secondary | ICD-10-CM | POA: Diagnosis not present

## 2020-04-13 DIAGNOSIS — R072 Precordial pain: Secondary | ICD-10-CM | POA: Diagnosis not present

## 2020-04-14 DIAGNOSIS — R072 Precordial pain: Secondary | ICD-10-CM | POA: Diagnosis not present

## 2020-04-15 DIAGNOSIS — R001 Bradycardia, unspecified: Secondary | ICD-10-CM | POA: Diagnosis not present

## 2020-04-15 DIAGNOSIS — R9431 Abnormal electrocardiogram [ECG] [EKG]: Secondary | ICD-10-CM | POA: Diagnosis not present

## 2020-09-19 ENCOUNTER — Other Ambulatory Visit: Payer: Self-pay

## 2020-09-19 ENCOUNTER — Emergency Department (HOSPITAL_BASED_OUTPATIENT_CLINIC_OR_DEPARTMENT_OTHER)
Admission: EM | Admit: 2020-09-19 | Discharge: 2020-09-19 | Disposition: A | Discharge status: Home / Self Care | Payer: BC Managed Care – PPO | Attending: Emergency Medicine | Admitting: Emergency Medicine

## 2020-09-19 DIAGNOSIS — R69 Illness, unspecified: Secondary | ICD-10-CM | POA: Insufficient documentation

## 2020-09-19 DIAGNOSIS — Z96652 Presence of left artificial knee joint: Secondary | ICD-10-CM | POA: Diagnosis not present

## 2020-09-19 DIAGNOSIS — Z7901 Long term (current) use of anticoagulants: Secondary | ICD-10-CM | POA: Diagnosis not present

## 2020-09-19 DIAGNOSIS — Z20822 Contact with and (suspected) exposure to covid-19: Secondary | ICD-10-CM

## 2020-09-19 DIAGNOSIS — I10 Essential (primary) hypertension: Secondary | ICD-10-CM | POA: Insufficient documentation

## 2020-09-19 DIAGNOSIS — Z79899 Other long term (current) drug therapy: Secondary | ICD-10-CM | POA: Diagnosis not present

## 2020-09-19 LAB — SARS CORONAVIRUS 2 (TAT 6-24 HRS): SARS Coronavirus 2: NEGATIVE

## 2020-09-19 NOTE — ED Provider Notes (Signed)
[facility] Provider Note: Tanya Dell, MD, FACEP  CSN: [id number] MRN: [id number] ARRIVAL: 09/19/20 at 0513 ROOM: MH10/[facility]10   CHIEF COMPLAINT  Covid Exposure   HISTORY OF PRESENT ILLNESS  09/19/20 5:30 AM Tanya Kelly is a 55 y.o. female whose daughter-in-law was tested positive for COVID after being sick for the past 2 to 3 days.  The patient wishes to be tested for COVID herself although she denies any symptoms at the present time.   Past Medical History:  Diagnosis Date  . Anginal pain (HCC)   . Arthritis   . Dyspnea   . Dysrhythmia    A-fib  . Hypertension     Past Surgical History:  Procedure Laterality Date  . BREAST BIOPSY  01/2020  . CESAREAN SECTION    . 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  Social History   Tobacco Use  . Smoking status: Never Smoker  . Smokeless tobacco: Never Used  Vaping Use  . Vaping Use: Never used  Substance Use Topics  . Alcohol use: Yes    Comment: socially  . Drug use: No    Prior to Admission medications   Medication Sig Start Date End Date Taking? Authorizing Provider  amLODipine (NORVASC) 5 MG tablet Take 1 tablet (5 mg total) by mouth daily. 08/20/18   Mesner, Barbara Cower, MD  apixaban (ELIQUIS) 2.5 MG TABS tablet Take 1 tablet (2.5 mg total) by mouth 2 (two) times daily. 02/16/20   Allena Katz, PA-C  atorvastatin (LIPITOR) 20 MG tablet Take 20 mg by mouth daily. 01/08/20   [provider]  cholecalciferol (VITAMIN D3) 25 MCG (1000 UNIT) tablet Take 1,000 Units by mouth daily.    [provider]  Dapsone 5 % topical gel Apply 1 application topically in the morning.  06/08/18   [provider]  gabapentin (NEURONTIN) 100 MG capsule Take 100 mg by mouth in the morning, at noon, and at bedtime.  01/08/20   [provider]  HYDROmorphone (DILAUDID) 2 MG tablet Take 1 tablet (2 mg total) by mouth every 4 (four) hours as needed for severe pain. 02/16/20   Allena Katz, PA-C  methocarbamol (ROBAXIN) 750 MG tablet Take 1 tablet (750 mg total) by mouth every 6 (six) hours as needed for muscle spasms. 02/16/20   Allena Katz, PA-C  metoprolol succinate (TOPROL-XL) 25 MG 24 hr tablet Take 25 mg by mouth daily.  12/15/19   [provider]  PREVIDENT 5000 SENSITIVE 1.1-5 % PSTE Place 1 application onto teeth every other day.  08/10/18   [provider]  SULFACLEANSE 8/4 8-4 % SUSP Apply 1 application topically in the morning.  07/01/18   [provider]    Allergies Codeine   REVIEW OF SYSTEMS  Negative except as noted here or in the History of Present Illness.   PHYSICAL EXAMINATION  Initial Vital Signs Blood pressure 135/86, pulse 71, temperature 97.8 F (36.6 C), resp. rate 16, height 5\' 1"  (1.549 m), weight 90.7 kg, SpO2 97 %.  Examination General: Well-developed, well-nourished female in no acute distress; appearance consistent with age of record HENT: normocephalic; atraumatic Eyes: pupils equal, round and reactive to light; extraocular muscles intact Neck: supple Heart: regular rate and rhythm Lungs: clear to auscultation bilaterally Abdomen:  soft; nondistended; nontender; bowel sounds present Extremities: No deformity; full range of motion Neurologic: Awake, alert and oriented; motor function intact in all extremities and symmetric; no facial droop Skin: Warm and dry Psychiatric: Normal mood and affect   RESULTS  Summary of this visit's results, reviewed and interpreted by myself:   EKG Interpretation  Date/Time:    Ventricular Rate:    PR Interval:    QRS Duration:   QT Interval:    QTC Calculation:   R Axis:     Text Interpretation:        Laboratory Studies: No results found for this or any previous visit (from the  past 24 hour(s)). Imaging Studies: No results found.  ED COURSE and MDM  Nursing notes, initial and subsequent vitals signs, including pulse oximetry, reviewed and interpreted by myself.  Vitals:   09/19/20 0526 09/19/20 0528  BP: 135/86   Pulse: 71   Resp: 16   Temp: 97.8 F (36.6 C)   SpO2: 97%   Weight:  90.7 kg  Height:  5\' 1"  (1.549 m)   Medications - No data to display  COVID test pending.  PROCEDURES  Procedures   ED DIAGNOSES     ICD-10-CM   1. Close exposure to COVID-19 virus  Z20.822        Julanne Schlueter, , MD 09/19/20 [phone]

## 2020-09-19 NOTE — ED Triage Notes (Signed)
C/o covid exposure to daughter, who tested positive last night. Denies fever/chills, sob, cp.

## 2021-04-18 IMAGING — CR DG CHEST 2V
2 series · 2 of 2 positions shown · non-contrast
Comparison: 07/07/2019

CLINICAL DATA: Preop evaluation.

EXAM:
CHEST - 2 VIEW

[w chest pa *]
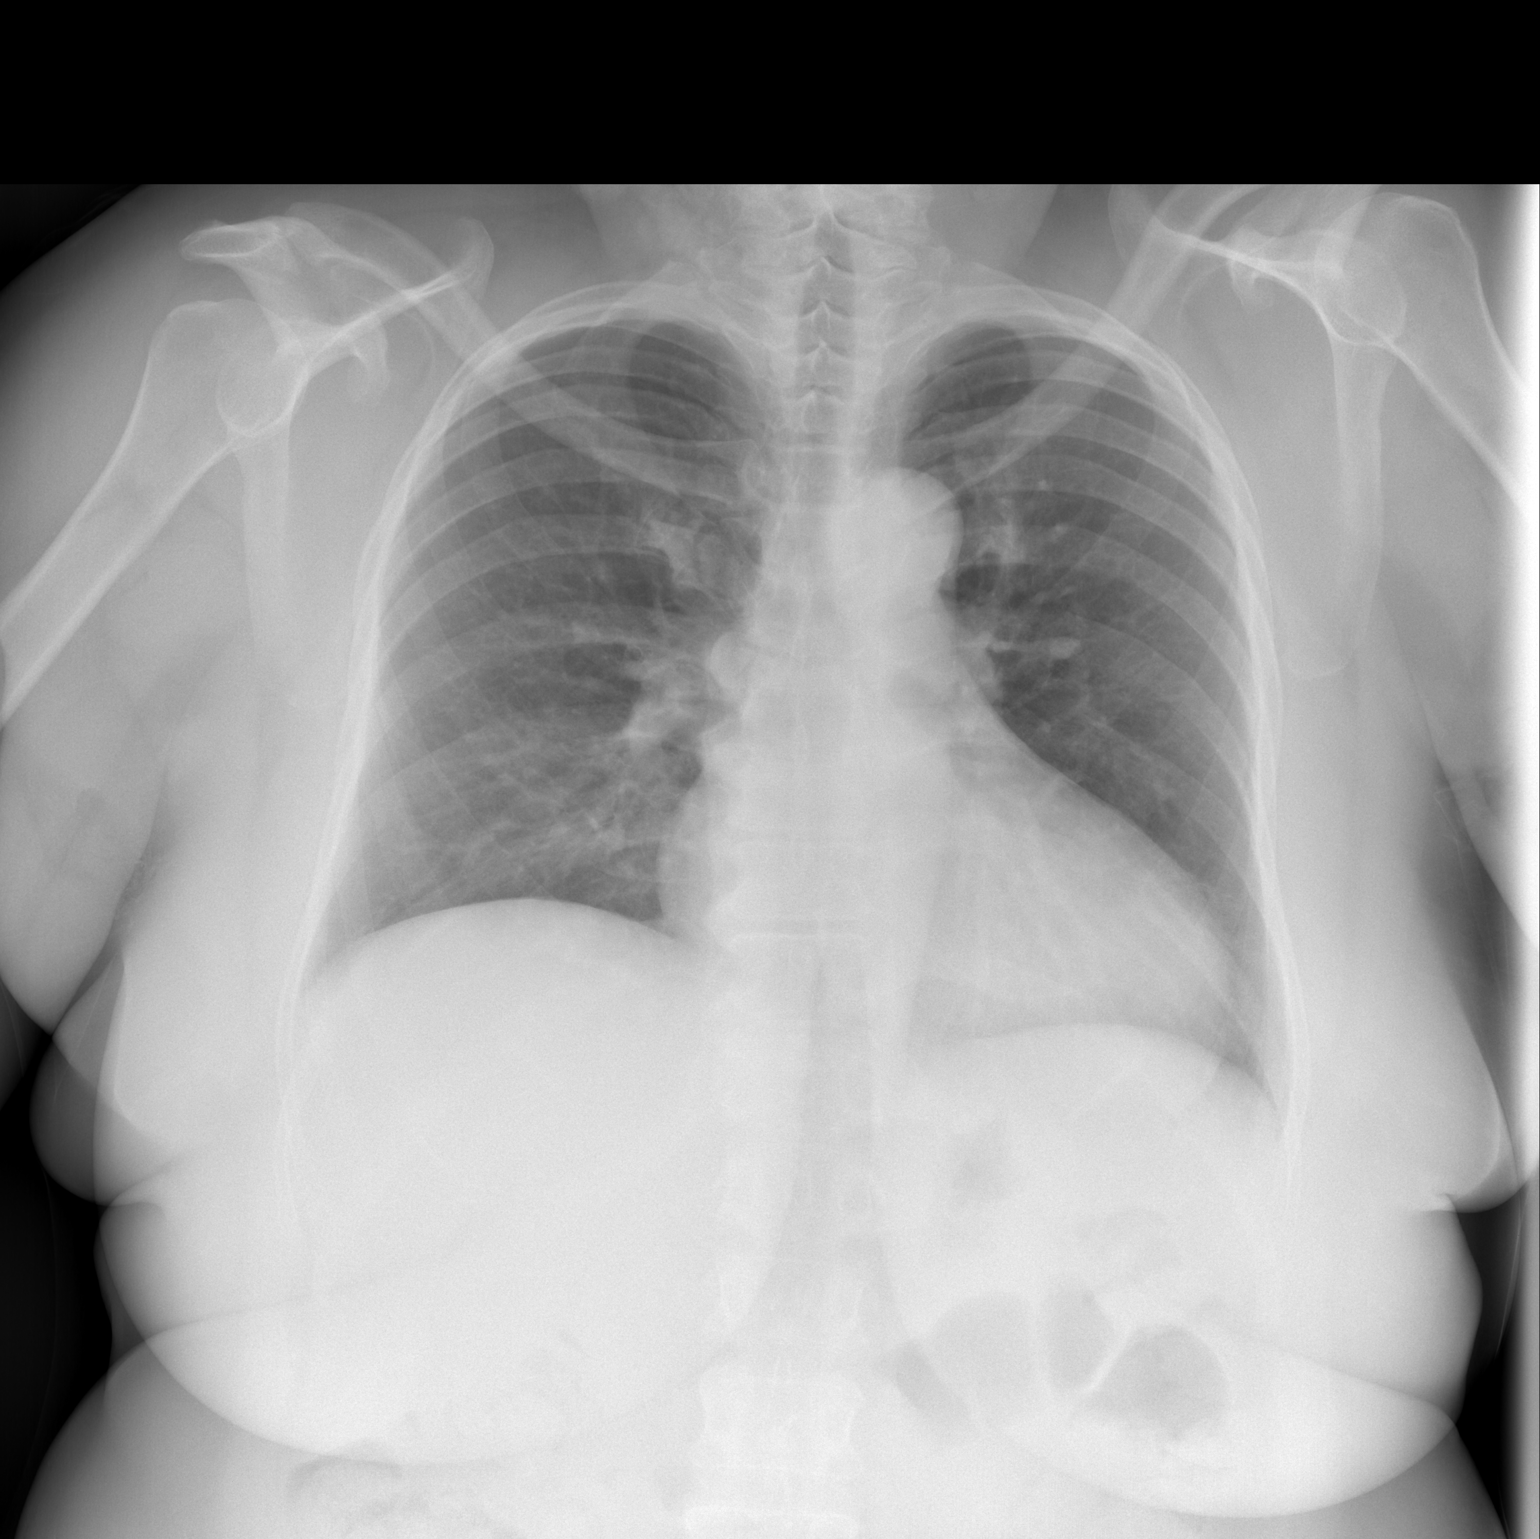

[w chest lat]
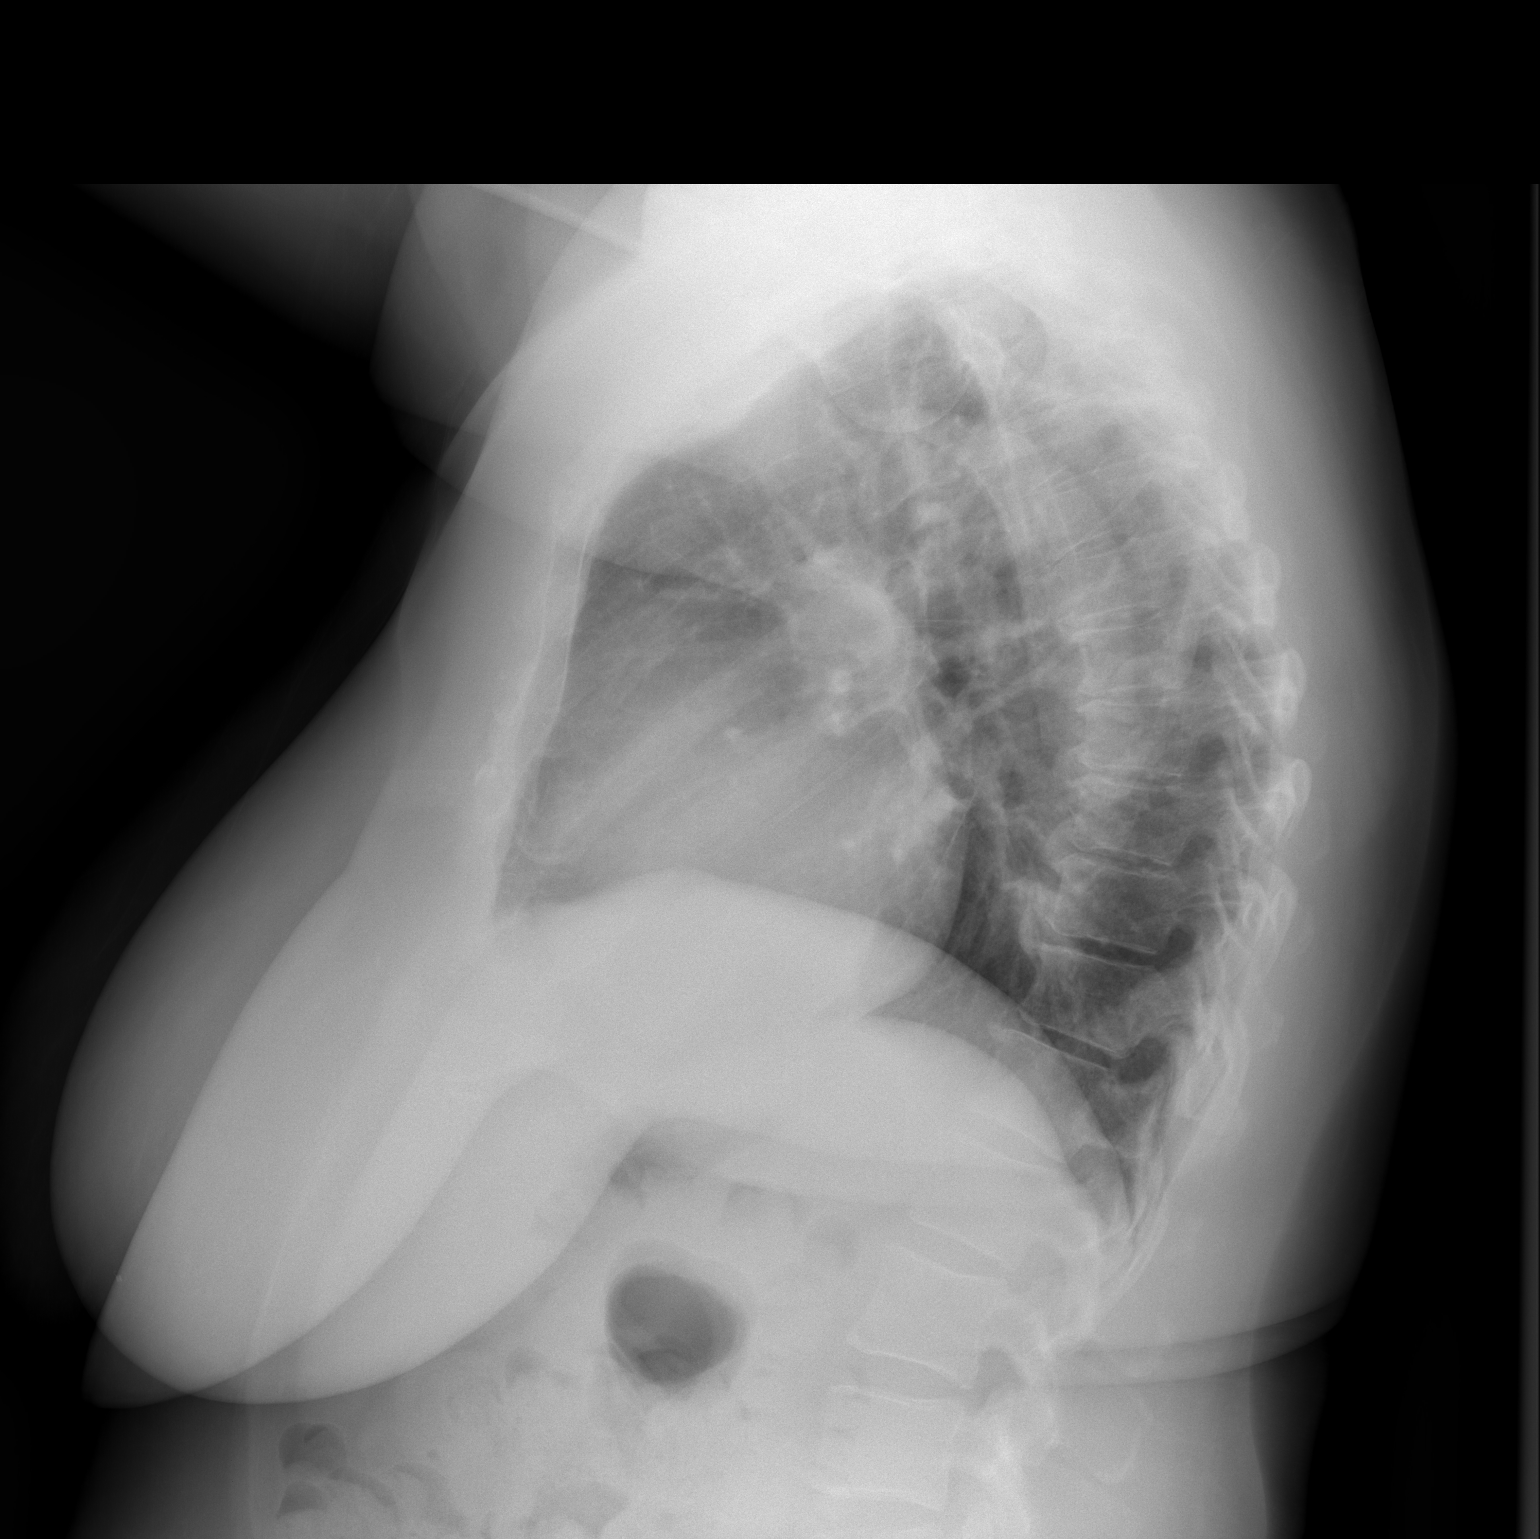

[2 of 2 positions shown; findings below may reference images not displayed]

FINDINGS: Mild cardiac enlargement. Vascularity normal. Lungs clear without
infiltrate effusion or mass.
IMPRESSION: No active cardiopulmonary disease.

## 2022-07-29 ENCOUNTER — Emergency Department (HOSPITAL_BASED_OUTPATIENT_CLINIC_OR_DEPARTMENT_OTHER)
Admission: EM | Admit: 2022-07-29 | Discharge: 2022-07-29 | Disposition: A | Discharge status: Home / Self Care | Payer: BC Managed Care – PPO | Attending: Emergency Medicine | Admitting: Emergency Medicine

## 2022-07-29 ENCOUNTER — Other Ambulatory Visit: Payer: Self-pay

## 2022-07-29 ENCOUNTER — Emergency Department (HOSPITAL_BASED_OUTPATIENT_CLINIC_OR_DEPARTMENT_OTHER): Payer: BC Managed Care – PPO

## 2022-07-29 ENCOUNTER — Encounter (HOSPITAL_BASED_OUTPATIENT_CLINIC_OR_DEPARTMENT_OTHER): Payer: Self-pay

## 2022-07-29 DIAGNOSIS — Z7901 Long term (current) use of anticoagulants: Secondary | ICD-10-CM | POA: Insufficient documentation

## 2022-07-29 DIAGNOSIS — E876 Hypokalemia: Secondary | ICD-10-CM | POA: Diagnosis not present

## 2022-07-29 DIAGNOSIS — R079 Chest pain, unspecified: Secondary | ICD-10-CM

## 2022-07-29 DIAGNOSIS — I10 Essential (primary) hypertension: Secondary | ICD-10-CM | POA: Diagnosis not present

## 2022-07-29 DIAGNOSIS — Z79899 Other long term (current) drug therapy: Secondary | ICD-10-CM | POA: Insufficient documentation

## 2022-07-29 LAB — BASIC METABOLIC PANEL
Anion gap: 7 (ref 5–15)
BUN: 12 mg/dL (ref 6–20)
CO2: 28 mmol/L (ref 22–32)
Calcium: 8.9 mg/dL (ref 8.9–10.3)
Chloride: 102 mmol/L (ref 98–111)
Creatinine, Ser: 0.74 mg/dL (ref 0.44–1.00)
GFR, Estimated: >60 mL/min (ref >60)
Glucose, Bld: 107 mg/dL — ABNORMAL HIGH (ref 70–99)
Potassium: 3.3 mmol/L — ABNORMAL LOW (ref 3.5–5.1)
Sodium: 137 mmol/L (ref 135–145)

## 2022-07-29 LAB — TROPONIN I (HIGH SENSITIVITY)
Troponin I (High Sensitivity): 6 ng/L (ref <18)
Troponin I (High Sensitivity): 7 ng/L (ref <18)

## 2022-07-29 LAB — CBC
HCT: 39.6 % (ref 36.0–46.0)
Hemoglobin: 12.5 g/dL (ref 12.0–15.0)
MCH: 27.2 pg (ref 26.0–34.0)
MCHC: 31.6 g/dL (ref 30.0–36.0)
MCV: 86.1 fL (ref 80.0–100.0)
Platelets: 203 10*3/uL (ref 150–400)
RBC: 4.6 MIL/uL (ref 3.87–5.11)
RDW: 13.1 % (ref 11.5–15.5)
WBC: 6.5 10*3/uL (ref 4.0–10.5)
nRBC: 0 % (ref 0.0–0.2)

## 2022-07-29 MED ORDER — POTASSIUM CHLORIDE CRYS ER 20 MEQ PO TBCR
40.0000 meq | EXTENDED_RELEASE_TABLET | Freq: Once | ORAL | Status: AC
  Administered 2022-07-29: 40 meq via ORAL
  Filled 2022-07-29: qty 2

## 2022-07-29 MED ORDER — POTASSIUM CHLORIDE CRYS ER 20 MEQ PO TBCR: 20.0000 meq | EXTENDED_RELEASE_TABLET | Freq: Two times a day (BID) | ORAL | 0 refills | Status: AC

## 2022-07-29 NOTE — ED Provider Notes (Signed)
[place] [facility] Provider Note   CSN: [id number] Arrival date & time: 07/29/22  0015     History  Chief Complaint  Patient presents with   Chest Pain    Tanya Kelly is a 57 y.o. female.  The history is provided by the patient.  Chest Pain She has history of hypertension, atrial fibrillation anticoagulated on apixaban and comes in because of an episode of chest pain at home.  She was watching television when she developed a sharp pain in her midsternal area without radiation.  She felt hot and flushed and felt like her heart was racing.  There is no associated dyspnea, nausea, diaphoresis.  Pain has resolved completely since arriving in the emergency department.  She had been evaluated by a cardiologist in the past and this pain was similar to what she had been evaluated for in the past.  She denies history of diabetes or hyperlipidemia, denies family history of premature coronary atherosclerosis.   Home Medications Prior to Admission medications   Medication Sig Start Date End Date Taking? Authorizing Provider  amLODipine (NORVASC) 5 MG tablet Take 1 tablet (5 mg total) by mouth daily. 08/20/18   Mesner, Corene Cornea, MD  apixaban (ELIQUIS) 2.5 MG TABS tablet Take 1 tablet (2.5 mg total) by mouth 2 (two) times daily. 02/16/20   Leighton Parody, PA-C  atorvastatin (LIPITOR) 20 MG tablet Take 20 mg by mouth daily. 01/08/20   [provider]  cholecalciferol (VITAMIN D3) 25 MCG (1000 UNIT) tablet Take 1,000 Units by mouth daily.    [provider]  Dapsone 5 % topical gel Apply 1 application topically in the morning.  06/08/18   [provider]  gabapentin (NEURONTIN) 100 MG capsule Take 100 mg by mouth in the morning, at noon, and at bedtime. 01/08/20   [provider]  HYDROmorphone (DILAUDID) 2 MG tablet Take 1 tablet (2 mg total) by mouth every 4 (four) hours as needed for severe pain. 02/16/20   Leighton Parody, PA-C   methocarbamol (ROBAXIN) 750 MG tablet Take 1 tablet (750 mg total) by mouth every 6 (six) hours as needed for muscle spasms. 02/16/20   Leighton Parody, PA-C  metoprolol succinate (TOPROL-XL) 25 MG 24 hr tablet Take 25 mg by mouth daily.  12/15/19   [provider]  PREVIDENT 5000 SENSITIVE 1.1-5 % PSTE Place 1 application onto teeth every other day.  08/10/18   [provider]  SULFACLEANSE 8/4 8-4 % SUSP Apply 1 application topically in the morning.  07/01/18   [provider]      Allergies    Codeine    Review of Systems   Review of Systems  Cardiovascular:  Positive for chest pain.  All other systems reviewed and are negative.   Physical Exam Updated Vital Signs BP 132/82 (BP Location: Right Arm)   Pulse (!) 58   Temp 98.5 F (36.9 C) (Oral)   Resp 15   Ht 5\' 1"  (1.549 m)   Wt 93 kg   SpO2 99%   BMI 38.73 kg/m  Physical Exam Vitals and nursing note reviewed.   57 year old female, resting comfortably and in no acute distress. Vital signs are significant for borderline low heart rate. Oxygen saturation is 99%, which is normal. Head is normocephalic and atraumatic. PERRLA, EOMI. Oropharynx is clear. Neck is nontender and supple without adenopathy or JVD. Back is nontender and there is no CVA tenderness. Lungs are clear  without rales, wheezes, or rhonchi. Chest is nontender. Heart has regular rate and rhythm without murmur. Abdomen is soft, flat, nontender. Extremities have no cyanosis or edema, full range of motion is present. Skin is warm and dry without rash. Neurologic: Mental status is normal, cranial nerves are intact..  ED Results / Procedures / Treatments   Labs (all labs ordered are listed, but only abnormal results are displayed) Labs Reviewed  BASIC METABOLIC PANEL - Abnormal; Notable for the following components:      Result Value   Potassium 3.3 (*)    Glucose, Bld 107 (*)    All other components within normal limits  CBC   PREGNANCY, URINE  TROPONIN I (HIGH SENSITIVITY)  TROPONIN I (HIGH SENSITIVITY)    EKG EKG Interpretation  Date/Time:  Saturday July 29 2022 00:22:49 EDT Ventricular Rate:  55 PR Interval:  168 QRS Duration: 80 QT Interval:  398 QTC Calculation: 381 R Axis:   54 Text Interpretation: Sinus rhythm Low voltage, precordial leads RSR' in V1 or V2, right VCD or RVH When compared with ECG of 12/05/2018, No significant change was found Confirmed by Delora Fuel ([id number]) on 07/29/2022 12:27:22 AM  Radiology DG Chest 2 View  Result Date: 07/29/2022 CLINICAL DATA:  Chest pain EXAM: CHEST - 2 VIEW COMPARISON:  02/04/2020 FINDINGS: Lungs are well expanded, symmetric, and clear. No pneumothorax or pleural effusion. Cardiac size within normal limits. Pulmonary vascularity is normal. Osseous structures are age-appropriate. No acute bone abnormality. IMPRESSION: No active cardiopulmonary disease. Electronically Signed   By: Fidela Salisbury M.D.   On: 07/29/2022 00:39    Procedures Procedures  Cardiac monitor shows normal sinus rhythm, per my interpretation.  Medications Ordered in ED Medications  potassium chloride SA (KLOR-CON M) CR tablet 40 mEq (has no administration in time range)    ED Course/ Medical Decision Making/ A&P                             Medical Decision Making Amount and/or Complexity of Data Reviewed Labs: ordered. Radiology: ordered.  Risk Prescription drug management.   Chest pain which is somewhat atypical, specific etiology unclear.  Differential diagnosis includes, but is not limited to, ACS, pulmonary embolism, GERD, musculoskeletal pain, pericarditis.  I have reviewed and interpreted her electrocardiogram, and my interpretation is low voltage but no ST or T changes.  Chest x-ray shows no active cardiopulmonary disease.  I have independently viewed the images, and agree with radiologist interpretation.  I have reviewed and interpreted her laboratory test, my  interpretation is slightly low potassium, borderline elevated random glucose level, normal initial troponin with repeat troponin pending.  I have ordered a dose of oral potassium.  Glucose will need to be monitored by her primary care provider.  Heart score is 2, which puts her at low risk for major adverse cardiac events in the next 6 weeks.  I have reviewed her past records, and she was last seen by her cardiologist on 01/12/2022 who had diagnosed her with noncardiac chest pain.  A stress echocardiogram on 10/06/2020 was negative.  Repeat troponin is unchanged.  She has had no recurrence of pain.  I am discharging her with prescription for potassium, follow-up with her PCP.  Final Clinical Impression(s) / ED Diagnoses Final diagnoses:  Nonspecific chest pain  Hypokalemia    Rx / DC Orders ED Discharge Orders          Ordered  potassium chloride SA (KLOR-CON M) 20 MEQ tablet  2 times daily        07/29/22 [id number]              Delora Fuel, MD [id number] [phone]

## 2022-07-29 NOTE — ED Triage Notes (Signed)
Pt reports CP that began around 9-10pm. Nothing makes it worse or better. Pt with hx of afib and on blood thinners. Pt denies fevers, N/V/D. No SOB.

## 2022-09-08 ENCOUNTER — Emergency Department (HOSPITAL_BASED_OUTPATIENT_CLINIC_OR_DEPARTMENT_OTHER): Payer: BC Managed Care – PPO

## 2022-09-08 ENCOUNTER — Encounter (HOSPITAL_BASED_OUTPATIENT_CLINIC_OR_DEPARTMENT_OTHER): Payer: Self-pay

## 2022-09-08 ENCOUNTER — Other Ambulatory Visit: Payer: Self-pay

## 2022-09-08 ENCOUNTER — Emergency Department (HOSPITAL_BASED_OUTPATIENT_CLINIC_OR_DEPARTMENT_OTHER)
Admission: EM | Admit: 2022-09-08 | Discharge: 2022-09-08 | Disposition: A | Discharge status: Home / Self Care | Payer: BC Managed Care – PPO | Attending: Emergency Medicine | Admitting: Emergency Medicine

## 2022-09-08 DIAGNOSIS — R079 Chest pain, unspecified: Secondary | ICD-10-CM | POA: Diagnosis present

## 2022-09-08 DIAGNOSIS — R0789 Other chest pain: Secondary | ICD-10-CM | POA: Insufficient documentation

## 2022-09-08 DIAGNOSIS — M79671 Pain in right foot: Secondary | ICD-10-CM | POA: Insufficient documentation

## 2022-09-08 DIAGNOSIS — Z7901 Long term (current) use of anticoagulants: Secondary | ICD-10-CM | POA: Diagnosis not present

## 2022-09-08 LAB — CBC WITH DIFFERENTIAL/PLATELET
Abs Immature Granulocytes: 0.01 10*3/uL (ref 0.00–0.07)
Basophils Absolute: 0.1 10*3/uL (ref 0.0–0.1)
Basophils Relative: 1 %
Eosinophils Absolute: 0.1 10*3/uL (ref 0.0–0.5)
Eosinophils Relative: 3 %
HCT: 39.1 % (ref 36.0–46.0)
Hemoglobin: 12.4 g/dL (ref 12.0–15.0)
Immature Granulocytes: 0 %
Lymphocytes Relative: 37 %
Lymphs Abs: 1.8 10*3/uL (ref 0.7–4.0)
MCH: 27.3 pg (ref 26.0–34.0)
MCHC: 31.7 g/dL (ref 30.0–36.0)
MCV: 86.1 fL (ref 80.0–100.0)
Monocytes Absolute: 0.4 10*3/uL (ref 0.1–1.0)
Monocytes Relative: 9 %
Neutro Abs: 2.5 10*3/uL (ref 1.7–7.7)
Neutrophils Relative %: 50 %
Platelets: 185 10*3/uL (ref 150–400)
RBC: 4.54 MIL/uL (ref 3.87–5.11)
RDW: 13.2 % (ref 11.5–15.5)
WBC: 4.9 10*3/uL (ref 4.0–10.5)
nRBC: 0 % (ref 0.0–0.2)

## 2022-09-08 LAB — COMPREHENSIVE METABOLIC PANEL
ALT: 15 U/L (ref 0–44)
AST: 24 U/L (ref 15–41)
Albumin: 3.8 g/dL (ref 3.5–5.0)
Alkaline Phosphatase: 74 U/L (ref 38–126)
Anion gap: 7 (ref 5–15)
BUN: 12 mg/dL (ref 6–20)
CO2: 24 mmol/L (ref 22–32)
Calcium: 8.8 mg/dL — ABNORMAL LOW (ref 8.9–10.3)
Chloride: 106 mmol/L (ref 98–111)
Creatinine, Ser: 0.78 mg/dL (ref 0.44–1.00)
GFR, Estimated: >60 mL/min (ref >60)
Glucose, Bld: 138 mg/dL — ABNORMAL HIGH (ref 70–99)
Potassium: 3.6 mmol/L (ref 3.5–5.1)
Sodium: 137 mmol/L (ref 135–145)
Total Bilirubin: 1.1 mg/dL (ref 0.3–1.2)
Total Protein: 7.7 g/dL (ref 6.5–8.1)

## 2022-09-08 LAB — TROPONIN I (HIGH SENSITIVITY): Troponin I (High Sensitivity): 6 ng/L (ref <18)

## 2022-09-08 MED ORDER — SODIUM CHLORIDE 0.9 % IV BOLUS
1000.0000 mL | Freq: Once | INTRAVENOUS | Status: AC
  Administered 2022-09-08: 1000 mL via INTRAVENOUS

## 2022-09-08 NOTE — Discharge Instructions (Signed)
Overall workup today is unremarkable.  I suspect that this is musculoskeletal type pain.  Recommend rest and Tylenol.  Cardiac workup was normal today.  Try to wear supportive shoes as you can.  Follow-up with primary care doctor.

## 2022-09-08 NOTE — ED Triage Notes (Signed)
Pt presents with complaint of midsternal chest pain that radiates to the right side X3 days. Pt also reports right sided weakness X 3 days. Also reports right sided headache.

## 2022-09-08 NOTE — ED Provider Notes (Signed)
[place] [facility] Provider Note   CSN: [id number] Arrival date & time: 09/08/22  6578     History  Chief Complaint  Patient presents with   Chest Pain    weakness   Weight Loss    Tanya Kelly is a 57 y.o. female.  Patient arrives with multiple complaints.  She has been having some right chest wall pain, right foot pain, right headache on and off for last couple days.  Right foot was really bothering her today but she denies any trauma or falls.  She has a history of anxiety, A-fib on Eliquis and high blood pressure.  She denies any exertional symptoms.  Denies any recent surgery or travel.  Denies any cough or sputum production.  Not having any active symptoms except for some discomfort in the right foot which is making it hard for her to walk she feels like she is weak in the right foot.  The history is provided by the patient.       Home Medications Prior to Admission medications   Medication Sig Start Date End Date Taking? Authorizing Provider  amLODipine (NORVASC) 5 MG tablet Take 1 tablet (5 mg total) by mouth daily. 08/20/18   Mesner, Barbara Cower, MD  apixaban (ELIQUIS) 2.5 MG TABS tablet Take 1 tablet (2.5 mg total) by mouth 2 (two) times daily. 02/16/20   Allena Katz, PA-C  atorvastatin (LIPITOR) 20 MG tablet Take 20 mg by mouth daily. 01/08/20   [provider]  cholecalciferol (VITAMIN D3) 25 MCG (1000 UNIT) tablet Take 1,000 Units by mouth daily.    [provider]  Dapsone 5 % topical gel Apply 1 application topically in the morning.  06/08/18   [provider]  gabapentin (NEURONTIN) 100 MG capsule Take 100 mg by mouth in the morning, at noon, and at bedtime. 01/08/20   [provider]  HYDROmorphone (DILAUDID) 2 MG tablet Take 1 tablet (2 mg total) by mouth every 4 (four) hours as needed for severe pain. 02/16/20   Allena Katz, PA-C  methocarbamol (ROBAXIN) 750 MG tablet Take 1 tablet (750 mg  total) by mouth every 6 (six) hours as needed for muscle spasms. 02/16/20   Allena Katz, PA-C  metoprolol succinate (TOPROL-XL) 25 MG 24 hr tablet Take 25 mg by mouth daily.  12/15/19   [provider]  potassium chloride SA (KLOR-CON M) 20 MEQ tablet Take 1 tablet (20 mEq total) by mouth 2 (two) times daily. 07/29/22   Dione Booze, MD  PREVIDENT 5000 SENSITIVE 1.1-5 % PSTE Place 1 application onto teeth every other day.  08/10/18   [provider]  SULFACLEANSE 8/4 8-4 % SUSP Apply 1 application topically in the morning.  07/01/18   [provider]      Allergies    Codeine    Review of Systems   Review of Systems  Physical Exam Updated Vital Signs BP 117/74   Pulse (!) 54   Temp 98.3 F (36.8 C) (Oral)   Resp 17   Ht 5\' 1"  (1.549 m)   Wt 93.9 kg   SpO2 100%   BMI 39.11 kg/m  Physical Exam Vitals and nursing note reviewed.  Constitutional:      General: She is not in acute distress.    Appearance: She is well-developed. She is not ill-appearing.  HENT:     Head: Normocephalic and atraumatic.  Eyes:     Extraocular Movements: Extraocular movements intact.  Conjunctiva/sclera: Conjunctivae normal.     Pupils: Pupils are equal, round, and reactive to light.  Cardiovascular:     Rate and Rhythm: Normal rate and regular rhythm.     Pulses:          Radial pulses are 2+ on the right side and 2+ on the left side.     Heart sounds: Normal heart sounds. No murmur heard. Pulmonary:     Effort: Pulmonary effort is normal. No respiratory distress.     Breath sounds: Normal breath sounds.  Abdominal:     Palpations: Abdomen is soft.     Tenderness: There is no abdominal tenderness.  Musculoskeletal:        General: No swelling. Normal range of motion.     Cervical back: Normal range of motion and neck supple.     Right lower leg: No edema.     Left lower leg: No edema.     Comments: Right foot exam is unremarkable  Skin:    General: Skin is warm  and dry.     Capillary Refill: Capillary refill takes less than 2 seconds.  Neurological:     General: No focal deficit present.     Mental Status: She is alert and oriented to person, place, and time.     Cranial Nerves: No cranial nerve deficit.     Motor: No weakness.     Comments: 5+ out of 5 strength throughout, normal sensation, no drift, normal finger-nose-finger, normal speech, normal visual fields  Psychiatric:        Mood and Affect: Mood normal.     ED Results / Procedures / Treatments   Labs (all labs ordered are listed, but only abnormal results are displayed) Labs Reviewed  COMPREHENSIVE METABOLIC PANEL - Abnormal; Notable for the following components:      Result Value   Glucose, Bld 138 (*)    Calcium 8.8 (*)    All other components within normal limits  CBC WITH DIFFERENTIAL/PLATELET  TROPONIN I (HIGH SENSITIVITY)    EKG EKG Interpretation  Date/Time:  Friday Sep 08 2022 09:28:57 EDT Ventricular Rate:  54 PR Interval:  184 QRS Duration: 78 QT Interval:  413 QTC Calculation: 392 R Axis:   65 Text Interpretation: Sinus rhythm Confirmed by Virgina Norfolk ([id number]) on 09/08/2022 9:32:20 AM  Radiology CT Head Wo Contrast  Result Date: 09/08/2022 CLINICAL DATA:  57 year old female with headache, chest pain, right side weakness. EXAM: CT HEAD WITHOUT CONTRAST TECHNIQUE: Contiguous axial images were obtained from the base of the skull through the vertex without intravenous contrast. RADIATION DOSE REDUCTION: This exam was performed according to the departmental dose-optimization program which includes automated exposure control, adjustment of the mA and/or kV according to patient size and/or use of iterative reconstruction technique. COMPARISON:  Brain MRI 06/21/2022, head CT 03/20/2019, and earlier FINDINGS: Brain: Normal cerebral volume. No midline shift, ventriculomegaly, mass effect, evidence of mass lesion, intracranial hemorrhage or evidence of cortically based acute  infarction. Gray-white matter differentiation is within normal limits throughout the brain. 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 Vascular: No suspicious intracranial vascular hyperdensity. Skull: Negative. Sinuses/Orbits: Visualized paranasal sinuses and mastoids are clear. Other: Visualized orbits and scalp soft tissues are within normal limits. IMPRESSION: 1.  No acute intracranial abnormality. 2. 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 Electronically Signed   By: Odessa Fleming M.D.   On: 09/08/2022 09:51   DG  Chest Portable 1 View  Result Date: 09/08/2022 CLINICAL DATA:  Midsternal chest pain and right-sided chest pain for 3 days EXAM: PORTABLE CHEST 1 VIEW COMPARISON:  X-ray 07/29/2022 and older FINDINGS: No consolidation, pneumothorax or effusion. No edema. Enlarged cardiopericardial silhouette. Film is under penetrated. Degenerative changes along the spine IMPRESSION: Enlarged heart.  No consolidation or edema Electronically Signed   By: Karen Kays M.D.   On: 09/08/2022 09:47    Procedures Procedures    Medications Ordered in ED Medications  sodium chloride 0.9 % bolus 1,000 mL (1,000 mLs Intravenous New Bag/Given 09/08/22 1005)    ED Course/ Medical Decision Making/ A&P                             Medical Decision Making Amount and/or Complexity of Data Reviewed Labs: ordered. Radiology: ordered.   Ajaysia Tomasi is here with right-sided headache, right-sided chest wall pain, right foot pain.  Normal vitals.  No fever.  She is on Eliquis for A-fib.  EKG shows sinus rhythm.  No ischemic changes.  Differential diagnosis likely arthritis/MSK related pain with may be migraine going on.  She is on blood thinners will get head CT to rule out head bleed.  Have no concern for stroke.  Very atypical story for ACS and will check troponin and chest x-ray.  EKG is very reassuring.  Have no concern for blood clot  as patient is already on blood thinner and atypical story for ACS/PE.  Neurologically she is intact.  Will get head CT, CBC, CMp, chest x-ray.  CT scan of the head and chest x-ray per radiology report are unremarkable.  Per my review interpretation labs no significant anemia or electrolyte abnormality or kidney injury or leukocytosis.  Overall suspect that this is muscle related pain especially in the right foot.  Will have her follow-up with her [facility] care doctor.  Patient discharged in good condition.  Understands return precautions.  This chart was dictated using voice recognition software.  Despite best efforts to proofread,  errors can occur which can change the documentation meaning.         Final Clinical Impression(s) / ED Diagnoses Final diagnoses:  Right foot pain  Atypical chest pain    Rx / DC Orders ED Discharge Orders     None         Virgina Norfolk, DO 09/08/22 1120

## 2022-11-06 ENCOUNTER — Emergency Department (HOSPITAL_BASED_OUTPATIENT_CLINIC_OR_DEPARTMENT_OTHER): Payer: Medicaid Other

## 2022-11-06 ENCOUNTER — Other Ambulatory Visit: Payer: Self-pay

## 2022-11-06 ENCOUNTER — Emergency Department (HOSPITAL_BASED_OUTPATIENT_CLINIC_OR_DEPARTMENT_OTHER)
Admission: EM | Admit: 2022-11-06 | Discharge: 2022-11-06 | Disposition: A | Discharge status: Home / Self Care | Payer: Medicaid Other | Attending: Emergency Medicine | Admitting: Emergency Medicine

## 2022-11-06 ENCOUNTER — Encounter (HOSPITAL_BASED_OUTPATIENT_CLINIC_OR_DEPARTMENT_OTHER): Payer: Self-pay | Admitting: Emergency Medicine

## 2022-11-06 DIAGNOSIS — Z79899 Other long term (current) drug therapy: Secondary | ICD-10-CM | POA: Insufficient documentation

## 2022-11-06 DIAGNOSIS — R0789 Other chest pain: Secondary | ICD-10-CM | POA: Diagnosis present

## 2022-11-06 DIAGNOSIS — Z7902 Long term (current) use of antithrombotics/antiplatelets: Secondary | ICD-10-CM | POA: Insufficient documentation

## 2022-11-06 DIAGNOSIS — I4891 Unspecified atrial fibrillation: Secondary | ICD-10-CM | POA: Diagnosis not present

## 2022-11-06 DIAGNOSIS — R079 Chest pain, unspecified: Secondary | ICD-10-CM

## 2022-11-06 DIAGNOSIS — K219 Gastro-esophageal reflux disease without esophagitis: Secondary | ICD-10-CM | POA: Diagnosis not present

## 2022-11-06 DIAGNOSIS — I10 Essential (primary) hypertension: Secondary | ICD-10-CM | POA: Insufficient documentation

## 2022-11-06 LAB — BASIC METABOLIC PANEL
Anion gap: 8 (ref 5–15)
BUN: 9 mg/dL (ref 6–20)
CO2: 27 mmol/L (ref 22–32)
Calcium: 9 mg/dL (ref 8.9–10.3)
Chloride: 104 mmol/L (ref 98–111)
Creatinine, Ser: 0.67 mg/dL (ref 0.44–1.00)
GFR, Estimated: >60 mL/min (ref >60)
Glucose, Bld: 119 mg/dL — ABNORMAL HIGH (ref 70–99)
Potassium: 3.6 mmol/L (ref 3.5–5.1)
Sodium: 139 mmol/L (ref 135–145)

## 2022-11-06 LAB — HEPATIC FUNCTION PANEL
ALT: 22 U/L (ref 0–44)
AST: 18 U/L (ref 15–41)
Albumin: 3.6 g/dL (ref 3.5–5.0)
Alkaline Phosphatase: 70 U/L (ref 38–126)
Bilirubin, Direct: 0.1 mg/dL (ref 0.0–0.2)
Indirect Bilirubin: 0.4 mg/dL (ref 0.3–0.9)
Total Bilirubin: 0.5 mg/dL (ref 0.3–1.2)
Total Protein: 7.3 g/dL (ref 6.5–8.1)

## 2022-11-06 LAB — TROPONIN I (HIGH SENSITIVITY)
Troponin I (High Sensitivity): 7 ng/L (ref <18)
Troponin I (High Sensitivity): 8 ng/L (ref <18)

## 2022-11-06 LAB — CBC
HCT: 36.2 % (ref 36.0–46.0)
Hemoglobin: 11.5 g/dL — ABNORMAL LOW (ref 12.0–15.0)
MCH: 27.3 pg (ref 26.0–34.0)
MCHC: 31.8 g/dL (ref 30.0–36.0)
MCV: 86 fL (ref 80.0–100.0)
Platelets: 175 10*3/uL (ref 150–400)
RBC: 4.21 MIL/uL (ref 3.87–5.11)
RDW: 13.4 % (ref 11.5–15.5)
WBC: 5.4 10*3/uL (ref 4.0–10.5)
nRBC: 0 % (ref 0.0–0.2)

## 2022-11-06 LAB — LIPASE, BLOOD: Lipase: 45 U/L (ref 11–51)

## 2022-11-06 MED ORDER — PANTOPRAZOLE SODIUM 40 MG PO TBEC
40.0000 mg | DELAYED_RELEASE_TABLET | Freq: Every day | ORAL | Status: DC
  Administered 2022-11-06: 40 mg via ORAL
  Filled 2022-11-06: qty 1

## 2022-11-06 MED ORDER — OMEPRAZOLE 20 MG PO CPDR: 20.0000 mg | DELAYED_RELEASE_CAPSULE | Freq: Every day | ORAL | 0 refills | Status: AC

## 2022-11-06 NOTE — ED Notes (Signed)
Pt takes Eliquis for Afib

## 2022-11-06 NOTE — Discharge Instructions (Addendum)
You were seen in the ER today for evaluation of your chest pain.  This could be some acid reflux that you are having.  For this, prescribed you some on occasion called omeprazole to take daily for the next 2 weeks.  I like for you to follow-up your cardiologist as well this pain you keep having.  Additionally, please call your primary care doctor for reevaluation.  I would avoid eating right before going to bed or laying down as this can worsen your symptoms.  I would like for you to follow-up with your hemoglobin as well as is a little low.  If you have any concerns, new or worsening symptoms, please return to the nearest emergency room for evaluation.  Contact a doctor if: Your chest pain does not go away. You feel depressed. You have a fever. Get help right away if: Your chest pain is worse. You have a cough that gets worse, or you cough up blood. You have very bad (severe) pain in your belly (abdomen). You pass out (faint). You have either of these for no clear reason: Sudden chest discomfort. Sudden discomfort in your arms, back, neck, or jaw. You have shortness of breath at any time. You suddenly start to sweat, or your skin gets clammy. You feel sick to your stomach (nauseous). You throw up (vomit). You suddenly feel lightheaded or dizzy. You feel very weak or tired. Your heart starts to beat fast, or it feels like it is skipping beats. These symptoms may be an emergency. Do not wait to see if the symptoms will go away. Get medical help right away. Call your local emergency services (911 in the [place] Do not drive yourself to the hospital.

## 2022-11-06 NOTE — ED Provider Notes (Signed)
[place] [facility] AT [facility] Provider Note   CSN: [id number] Arrival date & time: 11/06/22  7829     History {Add pertinent medical, surgical, social history, OB history to HPI:1} Chief Complaint  Patient presents with   Chest Pain    Tanya Kelly is a 57 y.o. female.   Chest Pain      Home Medications Prior to Admission medications   Medication Sig Start Date End Date Taking? Authorizing Provider  omeprazole (PRILOSEC) 20 MG capsule Take 1 capsule (20 mg total) by mouth daily. 11/06/22  Yes Achille Rich, PA-C  amLODipine (NORVASC) 5 MG tablet Take 1 tablet (5 mg total) by mouth daily. 08/20/18   Mesner, Barbara Cower, MD  apixaban (ELIQUIS) 2.5 MG TABS tablet Take 1 tablet (2.5 mg total) by mouth 2 (two) times daily. 02/16/20   Allena Katz, PA-C  atorvastatin (LIPITOR) 20 MG tablet Take 20 mg by mouth daily. 01/08/20   [provider]  cholecalciferol (VITAMIN D3) 25 MCG (1000 UNIT) tablet Take 1,000 Units by mouth daily.    [provider]  Dapsone 5 % topical gel Apply 1 application topically in the morning.  06/08/18   [provider]  gabapentin (NEURONTIN) 100 MG capsule Take 100 mg by mouth in the morning, at noon, and at bedtime. 01/08/20   [provider]  HYDROmorphone (DILAUDID) 2 MG tablet Take 1 tablet (2 mg total) by mouth every 4 (four) hours as needed for severe pain. 02/16/20   Allena Katz, PA-C  methocarbamol (ROBAXIN) 750 MG tablet Take 1 tablet (750 mg total) by mouth every 6 (six) hours as needed for muscle spasms. 02/16/20   Allena Katz, PA-C  metoprolol succinate (TOPROL-XL) 25 MG 24 hr tablet Take 25 mg by mouth daily.  12/15/19   [provider]  potassium chloride SA (KLOR-CON M) 20 MEQ tablet Take 1 tablet (20 mEq total) by mouth 2 (two) times daily. 07/29/22   Dione Booze, MD  PREVIDENT 5000 SENSITIVE 1.1-5 % PSTE Place 1 application onto teeth every other day.  08/10/18   [provider]  SULFACLEANSE 8/4 8-4 % SUSP Apply 1 application topically in the morning.  07/01/18   [provider]      Allergies    Codeine    Review of Systems   Review of Systems  Cardiovascular:  Positive for chest pain.    Physical Exam Updated Vital Signs BP 130/80   Pulse (!) 45   Temp 97.8 F (36.6 C)   Resp 13   Ht 5\' 1"  (1.549 m)   Wt 93 kg   LMP 11/17/2019   SpO2 100%   BMI 38.73 kg/m  Physical Exam  ED Results / Procedures / Treatments   Labs (all labs ordered are listed, but only abnormal results are displayed) Labs Reviewed  BASIC METABOLIC PANEL - Abnormal; Notable for the following components:      Result Value   Glucose, Bld 119 (*)    All other components within normal limits  CBC - Abnormal; Notable for the following components:   Hemoglobin 11.5 (*)    All other components within normal limits  LIPASE, BLOOD  HEPATIC FUNCTION PANEL  TROPONIN I (HIGH SENSITIVITY)  TROPONIN I (HIGH SENSITIVITY)    EKG EKG Interpretation Date/Time:  Monday November 06 2022 08:36:35 EDT Ventricular Rate:  51 PR Interval:  178 QRS Duration:  91 QT Interval:  415 QTC Calculation: 383 R Axis:  48  Text Interpretation: Sinus rhythm Low voltage, precordial leads Abnormal R-wave progression, early transition no sig change from previous Confirmed by Arby Barrette [phone]) on 11/06/2022 9:09:42 AM  Radiology US Abdomen Limited RUQ (LIVER/GB)  Result Date: 11/06/2022 CLINICAL DATA:  Right upper quadrant pain EXAM: ULTRASOUND ABDOMEN LIMITED RIGHT UPPER QUADRANT COMPARISON:  None Available. FINDINGS: Gallbladder: No gallstones or wall thickening visualized. No sonographic Murphy sign noted by sonographer. Common bile duct: Diameter: 2 mm Liver: Mildly echogenic hepatic parenchyma. Portal vein is patent on color Doppler imaging with normal direction of blood flow towards the liver. Other: None. IMPRESSION: No gallstones or ductal dilatation. Mild fatty liver  infiltration Electronically Signed   By: Karen Kays M.D.   On: 11/06/2022 10:59   DG Chest 2 View  Result Date: 11/06/2022 CLINICAL DATA:  Chest pain. EXAM: CHEST - 2 VIEW COMPARISON:  Chest radiograph 09/08/2022 FINDINGS: The cardiac silhouette remains enlarged. No airspace consolidation, edema, pleural effusion, or pneumothorax is identified. No acute osseous abnormality is seen. IMPRESSION: No active cardiopulmonary disease. Electronically Signed   By: Sebastian Ache M.D.   On: 11/06/2022 08:55    Procedures Procedures  {Document cardiac monitor, telemetry assessment procedure when appropriate:1}  Medications Ordered in ED Medications  pantoprazole (PROTONIX) EC tablet 40 mg (has no administration in time range)    ED Course/ Medical Decision Making/ A&P   {   Click here for ABCD2, HEART and other calculatorsREFRESH Note before signing :1}                          Medical Decision Making Amount and/or Complexity of Data Reviewed Labs: ordered. Radiology: ordered.  Risk Prescription drug management.   ***  {Document critical care time when appropriate:1} {Document review of labs and clinical decision tools ie heart score, Chads2Vasc2 etc:1}  {Document your independent review of radiology images, and any outside records:1} {Document your discussion with family members, caretakers, and with consultants:1} {Document social determinants of health affecting pt's care:1} {Document your decision making why or why not admission, treatments were needed:1} Final Clinical Impression(s) / ED Diagnoses Final diagnoses:  Nonspecific chest pain  Gastroesophageal reflux disease without esophagitis    Rx / DC Orders ED Discharge Orders          Ordered    omeprazole (PRILOSEC) 20 MG capsule  Daily        11/06/22 1616

## 2022-11-06 NOTE — ED Triage Notes (Signed)
Right sided chest pain for 3-4 days, radiating down left arm.  Took tylenol last night which helped some but now has returned.  No sob, no N/V, no fever, no cold symptoms.

## 2022-11-06 NOTE — ED Notes (Signed)
Pt ambulatory to BR

## 2022-11-06 NOTE — ED Notes (Signed)
Notified lab of additional orders

## 2023-08-01 ENCOUNTER — Emergency Department (HOSPITAL_BASED_OUTPATIENT_CLINIC_OR_DEPARTMENT_OTHER)

## 2023-08-01 ENCOUNTER — Other Ambulatory Visit: Payer: Self-pay

## 2023-08-01 ENCOUNTER — Emergency Department (HOSPITAL_BASED_OUTPATIENT_CLINIC_OR_DEPARTMENT_OTHER)
Admission: EM | Admit: 2023-08-01 | Discharge: 2023-08-01 | Disposition: A | Discharge status: Home / Self Care | Attending: Emergency Medicine | Admitting: Emergency Medicine

## 2023-08-01 ENCOUNTER — Encounter (HOSPITAL_BASED_OUTPATIENT_CLINIC_OR_DEPARTMENT_OTHER): Payer: Self-pay | Admitting: Emergency Medicine

## 2023-08-01 DIAGNOSIS — R079 Chest pain, unspecified: Secondary | ICD-10-CM | POA: Diagnosis present

## 2023-08-01 DIAGNOSIS — R0789 Other chest pain: Secondary | ICD-10-CM | POA: Insufficient documentation

## 2023-08-01 DIAGNOSIS — I10 Essential (primary) hypertension: Secondary | ICD-10-CM | POA: Diagnosis not present

## 2023-08-01 LAB — CBC WITH DIFFERENTIAL/PLATELET
Abs Immature Granulocytes: 0.01 10*3/uL (ref 0.00–0.07)
Basophils Absolute: 0 10*3/uL (ref 0.0–0.1)
Basophils Relative: 1 %
Eosinophils Absolute: 0.1 10*3/uL (ref 0.0–0.5)
Eosinophils Relative: 2 %
HCT: 35.3 % — ABNORMAL LOW (ref 36.0–46.0)
Hemoglobin: 11.6 g/dL — ABNORMAL LOW (ref 12.0–15.0)
Immature Granulocytes: 0 %
Lymphocytes Relative: 52 %
Lymphs Abs: 2.5 10*3/uL (ref 0.7–4.0)
MCH: 28.2 pg (ref 26.0–34.0)
MCHC: 32.9 g/dL (ref 30.0–36.0)
MCV: 85.7 fL (ref 80.0–100.0)
Monocytes Absolute: 0.4 10*3/uL (ref 0.1–1.0)
Monocytes Relative: 8 %
Neutro Abs: 1.8 10*3/uL (ref 1.7–7.7)
Neutrophils Relative %: 37 %
Platelets: 177 10*3/uL (ref 150–400)
RBC: 4.12 MIL/uL (ref 3.87–5.11)
RDW: 13.3 % (ref 11.5–15.5)
WBC: 4.8 10*3/uL (ref 4.0–10.5)
nRBC: 0 % (ref 0.0–0.2)

## 2023-08-01 LAB — BASIC METABOLIC PANEL
Anion gap: 5 (ref 5–15)
BUN: 11 mg/dL (ref 6–20)
CO2: 29 mmol/L (ref 22–32)
Calcium: 8.9 mg/dL (ref 8.9–10.3)
Chloride: 104 mmol/L (ref 98–111)
Creatinine, Ser: 0.82 mg/dL (ref 0.44–1.00)
GFR, Estimated: >60 mL/min (ref >60)
Glucose, Bld: 104 mg/dL — ABNORMAL HIGH (ref 70–99)
Potassium: 3.4 mmol/L — ABNORMAL LOW (ref 3.5–5.1)
Sodium: 138 mmol/L (ref 135–145)

## 2023-08-01 LAB — TROPONIN I (HIGH SENSITIVITY): Troponin I (High Sensitivity): 6 ng/L (ref <18)

## 2023-08-01 MED ORDER — METHOCARBAMOL 500 MG PO TABS: 500.0000 mg | ORAL_TABLET | Freq: Three times a day (TID) | ORAL | 0 refills | Status: AC | PRN

## 2023-08-01 NOTE — ED Triage Notes (Signed)
 Pt c/o LLE pain last night; during the night she states pain went to entire LT side of body and then had intermittent sharp pains under RT breast; denies other sxs

## 2023-08-01 NOTE — ED Notes (Signed)
 Pt alert and oriented X 4 at the time of discharge. RR even and unlabored. No acute distress noted. Pt verbalized understanding of discharge instructions as discussed. Pt ambulatory to lobby at time of discharge.

## 2023-08-01 NOTE — ED Provider Notes (Signed)
 Emergency Department Provider Note   I have reviewed the triage vital signs and the nursing notes.   HISTORY  Chief Complaint Chest Pain   HPI Tanya Kelly is a 58 y.o. female presents to the emergency department for evaluation of intermittent sharp pains to the left chest occasionally running to the right.  She had some associated left lower extremity discomfort last night which is resolved.  No injury.  No shortness of breath.  No history of PE or DVT.  No cough/congestion.  No fever.   Past Medical History:  Diagnosis Date   Anginal pain (HCC)    Arthritis    Dyspnea    Dysrhythmia    A-fib   Hypertension     Review of Systems  Constitutional: No fever/chills Cardiovascular: Positive chest pain. Respiratory: Denies shortness of breath. Gastrointestinal: No abdominal pain.  No nausea, no vomiting.  Musculoskeletal: Negative for back pain. Skin: Negative for rash. Neurological: Negative for headaches, focal weakness or numbness.  ____________________________________________   PHYSICAL EXAM:  VITAL SIGNS: ED Triage Vitals [08/01/23 1412]  Encounter Vitals Group     BP 116/80     Pulse Rate (!) 53     Resp 18     Temp 97.8 F (36.6 C)     Temp src      SpO2 98 %   Constitutional: Alert and oriented. Well appearing and in no acute distress. Eyes: Conjunctivae are normal.  Head: Atraumatic. Nose: No congestion/rhinnorhea. Mouth/Throat: Mucous membranes are moist.  Neck: No stridor.   Cardiovascular: Normal rate, regular rhythm. Good peripheral circulation. Grossly normal heart sounds.   Respiratory: Normal respiratory effort.  No retractions. Lungs CTAB. Gastrointestinal: Soft and nontender. No distention.  Musculoskeletal: No lower extremity tenderness nor edema. No gross deformities of extremities. Neurologic:  Normal speech and language. No gross focal neurologic deficits are appreciated.  Skin:  Skin is warm, dry and intact. No rash  noted.  ____________________________________________   LABS (all labs ordered are listed, but only abnormal results are displayed)  Labs Reviewed  BASIC METABOLIC PANEL - Abnormal; Notable for the following components:      Result Value   Potassium 3.4 (*)    Glucose, Bld 104 (*)    All other components within normal limits  CBC WITH DIFFERENTIAL/PLATELET - Abnormal; Notable for the following components:   Hemoglobin 11.6 (*)    HCT 35.3 (*)    All other components within normal limits  TROPONIN I (HIGH SENSITIVITY)  TROPONIN I (HIGH SENSITIVITY)   ____________________________________________  EKG   EKG Interpretation Date/Time:  Wednesday August 01 2023 14:14:59 EDT Ventricular Rate:  53 PR Interval:  174 QRS Duration:  86 QT Interval:  422 QTC Calculation: 395 R Axis:   55  Text Interpretation: Sinus bradycardia Otherwise normal ECG When compared with ECG of 06-Nov-2022 08:36, PREVIOUS ECG IS PRESENT Confirmed by Alona Bene [phone]) on 08/01/2023 4:52:10 PM        ____________________________________________  RADIOLOGY  DG Chest 2 View Result Date: 08/01/2023 CLINICAL DATA:  Chest pain. EXAM: CHEST - 2 VIEW COMPARISON:  November 06, 2022 FINDINGS: The cardiac silhouette is mildly enlarged and unchanged in size. Both lungs are clear. No acute osseous abnormalities are identified. IMPRESSION: No active cardiopulmonary disease. Electronically Signed   By: Aram Candela M.D.   On: 08/01/2023 15:30    ____________________________________________   PROCEDURES  Procedure(s) performed:   Procedures  None  ____________________________________________   INITIAL IMPRESSION / ASSESSMENT AND PLAN /  ED COURSE  Pertinent labs & imaging results that were available during my care of the patient were reviewed by me and considered in my medical decision making (see chart for details).   This patient is Presenting for Evaluation of CP, which does require a range of  treatment options, and is a complaint that involves a high risk of morbidity and mortality.  The Differential Diagnoses includes but is not exclusive to acute coronary syndrome, aortic dissection, pulmonary embolism, cardiac tamponade, community-acquired pneumonia, pericarditis, musculoskeletal chest wall pain, etc.  Clinical Laboratory Tests Ordered, included troponin which is normal. No plan for trending with more constant pain for the last 12 hours. No AKI. No anemia.   Radiologic Tests Ordered, included CXR. I independently interpreted the images and agree with radiology interpretation.   Cardiac Monitor Tracing which shows NSR.    Social Determinants of Health Risk patient is a non-smoker.   Medical Decision Making: Summary:  The patient presents to the emergency department for evaluation of fairly atypical chest pain.  Symptoms been fairly constant over at least the last 12 hours.  Single negative troponin here reassuring to rule out ACS.  History not completely consistent with ACS.  Considered PE feel this is much less likely given fairly easy to reproduce pain. Suspect MSk etiology. Plan for Robaxin and PCP follow up.   Patient's presentation is most consistent with acute presentation with potential threat to life or bodily function.   Disposition: discharge  ____________________________________________  FINAL CLINICAL IMPRESSION(S) / ED DIAGNOSES  Final diagnoses:  Atypical chest pain     NEW OUTPATIENT MEDICATIONS STARTED DURING THIS VISIT:  New Prescriptions   METHOCARBAMOL (ROBAXIN) 500 MG TABLET    Take 1 tablet (500 mg total) by mouth every 8 (eight) hours as needed for muscle spasms.    Note:  This document was prepared using Dragon voice recognition software and may include unintentional dictation errors.  Alona Bene, MD, [facility]    Ladarryl Wrage, Arlyss Repress, MD 08/03/23 [phone]

## 2023-08-01 NOTE — Discharge Instructions (Addendum)
